# Patient Record
Sex: Male | Born: 1960
Health system: Southern US, Community
[De-identification: ages and names within clinical notes are randomized; demographics above are authoritative.]

## PROBLEM LIST (undated history)

## (undated) DIAGNOSIS — E7800 Pure hypercholesterolemia, unspecified: Secondary | ICD-10-CM

## (undated) DIAGNOSIS — R52 Pain, unspecified: Secondary | ICD-10-CM

## (undated) DIAGNOSIS — J40 Bronchitis, not specified as acute or chronic: Secondary | ICD-10-CM

## (undated) DIAGNOSIS — Z87442 Personal history of urinary calculi: Secondary | ICD-10-CM

## (undated) DIAGNOSIS — K219 Gastro-esophageal reflux disease without esophagitis: Secondary | ICD-10-CM

## (undated) DIAGNOSIS — M1990 Unspecified osteoarthritis, unspecified site: Secondary | ICD-10-CM

## (undated) DIAGNOSIS — B029 Zoster without complications: Secondary | ICD-10-CM

## (undated) DIAGNOSIS — E119 Type 2 diabetes mellitus without complications: Secondary | ICD-10-CM

## (undated) DIAGNOSIS — I10 Essential (primary) hypertension: Secondary | ICD-10-CM

## (undated) DIAGNOSIS — J189 Pneumonia, unspecified organism: Secondary | ICD-10-CM

## (undated) HISTORY — PX: OTHER SURGICAL HISTORY: SHX169

---

## 2001-06-18 ENCOUNTER — Ambulatory Visit (HOSPITAL_COMMUNITY): Admission: RE | Admit: 2001-06-18 | Discharge: 2001-06-18 | Payer: Self-pay | Admitting: Neurosurgery

## 2001-06-18 ENCOUNTER — Encounter: Payer: Self-pay | Admitting: Neurosurgery

## 2001-06-26 ENCOUNTER — Encounter: Payer: Self-pay | Admitting: Neurosurgery

## 2001-06-26 ENCOUNTER — Inpatient Hospital Stay (HOSPITAL_COMMUNITY): Admission: RE | Admit: 2001-06-26 | Discharge: 2001-06-27 | Payer: Self-pay | Admitting: Neurosurgery

## 2001-07-23 ENCOUNTER — Encounter: Payer: Self-pay | Admitting: Neurosurgery

## 2001-07-23 ENCOUNTER — Encounter: Admission: RE | Admit: 2001-07-23 | Discharge: 2001-07-23 | Payer: Self-pay | Admitting: Neurosurgery

## 2006-01-04 ENCOUNTER — Ambulatory Visit (HOSPITAL_COMMUNITY): Admission: RE | Admit: 2006-01-04 | Discharge: 2006-01-04 | Payer: Self-pay | Admitting: Pulmonary Disease

## 2010-04-02 HISTORY — PX: OTHER SURGICAL HISTORY: SHX169

## 2010-11-14 ENCOUNTER — Ambulatory Visit (HOSPITAL_BASED_OUTPATIENT_CLINIC_OR_DEPARTMENT_OTHER)
Admission: RE | Admit: 2010-11-14 | Discharge: 2010-11-14 | Disposition: A | Discharge status: Home / Self Care | Payer: Worker's Compensation | Source: Ambulatory Visit | Attending: Orthopedic Surgery | Admitting: Orthopedic Surgery

## 2010-11-14 DIAGNOSIS — Z01810 Encounter for preprocedural cardiovascular examination: Secondary | ICD-10-CM | POA: Insufficient documentation

## 2010-11-14 DIAGNOSIS — IMO0002 Reserved for concepts with insufficient information to code with codable children: Secondary | ICD-10-CM | POA: Insufficient documentation

## 2010-11-14 DIAGNOSIS — X58XXXA Exposure to other specified factors, initial encounter: Secondary | ICD-10-CM | POA: Insufficient documentation

## 2010-11-14 DIAGNOSIS — Z01812 Encounter for preprocedural laboratory examination: Secondary | ICD-10-CM | POA: Insufficient documentation

## 2010-11-14 LAB — POCT I-STAT 4, (NA,K, GLUC, HGB,HCT)
Glucose, Bld: 131 mg/dL — ABNORMAL HIGH (ref 70–99)
HCT: 53 % — ABNORMAL HIGH (ref 39.0–52.0)
Hemoglobin: 18 g/dL — ABNORMAL HIGH (ref 13.0–17.0)
Potassium: 4.3 mEq/L (ref 3.5–5.1)
Sodium: 141 mEq/L (ref 135–145)

## 2010-11-20 NOTE — Op Note (Signed)
NAME:  Tony Santos, Tony Santos NO.:  [id number]  MEDICAL RECORD NO.:  [id number]  LOCATION:                                 FACILITY:  PHYSICIAN:  Ollen Gross, M.D.         DATE OF BIRTH:  DATE OF PROCEDURE:  11/14/2010 DATE OF DISCHARGE:                              OPERATIVE REPORT   PREOPERATIVE DIAGNOSIS:  Right knee medial meniscal tear.  POSTOPERATIVE DIAGNOSIS:  Right knee medial meniscal tear.  PROCEDURE:  Right knee arthroscopy and meniscal debridement.  SURGEON:  Ollen Gross, M.D.  ASSISTANT:  None.  ANESTHESIA:  General.  ESTIMATED BLOOD LOSS:  Minimal.  DRAINS:  None.  COMPLICATIONS:  None.  CONDITION:  Stable to recovery.  BRIEF CLINICAL NOTE:  Mr. Lazenby is a 50 year old male who had on-the- job injury over a month ago.  He developed severe right knee pain and mechanical symptoms.  Exam and history suggested medial meniscal tear confirmed by MRI.  He also has some medial compartment arthritis, but did not report having any symptoms prior to this injury.  He presents now for arthroscopy and debridement.  PROCEDURE IN DETAIL:  After successful administration of general anesthetic, a tourniquet was placed on his right thigh and his right lower extremity was prepped and draped in the usual sterile fashion. Standard superomedial and inferolateral incisions were made and flow cannula passed superomedial camera passed inferolateral.  Arthroscopic visualization proceeds.  Undersurface of patella and trochlea looked normal.  Mediolateral gutters visualized and there no loose bodies. Flexion valgus force applied to the knee and medial compartment centered.  On the medial most margin of the medial tibial plateau is a 1 x 1 cm area of exposed bone.  There was thin cartilage surrounding the rim of this, but it is stable.  There is significant tearing the body and posterior horn of the medial meniscus which is unstable.  There is also an area on  the medial femoral condyle about 2 x 2 cm with exposed bone.  A spinal needle was used to localize the inferomedial portal. Small incision made and dilator placed.  The unstable medial meniscus was then debrided back to stable base with a combination of baskets and a 4.2-mm shaver.  I then sealed off the edges with the ArthroCare device.  The joint surfaces were thoroughly inspected in the medial compartment and the right knee areas of exposed bone but no other unstable cartilage in the compartment.  Intercondylar notch was visualized and ACL was normal.  Lateral compartment centered and it looks normal.  Joints again inspected.  No other tears, defects, or loose bodies noted.  Arthroscopic equipment was then removed from the inferior portals which were closed with interrupted 4-0 nylon.  20 mL of 0.25% Marcaine with epi are injected through the inflow cannula and that is removed and that portal closed with nylon.  The incisions were then cleaned and dried and a bulky sterile dressing was applied.  He was then awakened and transported to recovery in stable condition.     Ollen Gross, M.D.     FA/MEDQ  D:  11/14/2010  T:  11/15/2010  Job:  [id number]  Electronically Signed by Ollen Gross M.D. on 11/20/2010 04:37:59 PM

## 2013-10-15 ENCOUNTER — Other Ambulatory Visit: Payer: Self-pay | Admitting: Orthopedic Surgery

## 2013-10-16 ENCOUNTER — Encounter (HOSPITAL_COMMUNITY): Payer: Self-pay | Admitting: Pharmacy Technician

## 2013-10-20 ENCOUNTER — Encounter (HOSPITAL_COMMUNITY)
Admission: RE | Admit: 2013-10-20 | Discharge: 2013-10-20 | Disposition: A | Discharge status: Home / Self Care | Payer: Worker's Compensation | Source: Ambulatory Visit | Attending: Orthopedic Surgery | Admitting: Orthopedic Surgery

## 2013-10-20 ENCOUNTER — Ambulatory Visit (HOSPITAL_COMMUNITY)
Admission: RE | Admit: 2013-10-20 | Discharge: 2013-10-20 | Disposition: A | Discharge status: Home / Self Care | Payer: Worker's Compensation | Source: Ambulatory Visit | Attending: Orthopedic Surgery | Admitting: Orthopedic Surgery

## 2013-10-20 ENCOUNTER — Encounter (INDEPENDENT_AMBULATORY_CARE_PROVIDER_SITE_OTHER): Payer: Self-pay

## 2013-10-20 ENCOUNTER — Encounter (HOSPITAL_COMMUNITY): Payer: Self-pay

## 2013-10-20 DIAGNOSIS — Z01812 Encounter for preprocedural laboratory examination: Secondary | ICD-10-CM | POA: Insufficient documentation

## 2013-10-20 DIAGNOSIS — Z01818 Encounter for other preprocedural examination: Secondary | ICD-10-CM | POA: Insufficient documentation

## 2013-10-20 DIAGNOSIS — Z981 Arthrodesis status: Secondary | ICD-10-CM | POA: Insufficient documentation

## 2013-10-20 DIAGNOSIS — Z01810 Encounter for preprocedural cardiovascular examination: Secondary | ICD-10-CM | POA: Insufficient documentation

## 2013-10-20 HISTORY — DX: Unspecified osteoarthritis, unspecified site: M19.90

## 2013-10-20 HISTORY — DX: Bronchitis, not specified as acute or chronic: J40

## 2013-10-20 HISTORY — DX: Pneumonia, unspecified organism: J18.9

## 2013-10-20 HISTORY — DX: Essential (primary) hypertension: I10

## 2013-10-20 HISTORY — DX: Personal history of urinary calculi: Z87.442

## 2013-10-20 HISTORY — DX: Zoster without complications: B02.9

## 2013-10-20 HISTORY — DX: Gastro-esophageal reflux disease without esophagitis: K21.9

## 2013-10-20 HISTORY — DX: Pain, unspecified: R52

## 2013-10-20 HISTORY — DX: Pure hypercholesterolemia, unspecified: E78.00

## 2013-10-20 LAB — COMPREHENSIVE METABOLIC PANEL
ALT: 36 U/L (ref 0–53)
ANION GAP: 14 (ref 5–15)
AST: 26 U/L (ref 0–37)
Albumin: 4.1 g/dL (ref 3.5–5.2)
Alkaline Phosphatase: 80 U/L (ref 39–117)
BILIRUBIN TOTAL: 0.2 mg/dL — AB (ref 0.3–1.2)
BUN: 16 mg/dL (ref 6–23)
CALCIUM: 9.5 mg/dL (ref 8.4–10.5)
CHLORIDE: 95 meq/L — AB (ref 96–112)
CO2: 28 mEq/L (ref 19–32)
CREATININE: 0.97 mg/dL (ref 0.50–1.35)
GFR calc Af Amer: >90 mL/min (ref >90)
GFR calc non Af Amer: >90 mL/min (ref >90)
Glucose, Bld: 324 mg/dL — ABNORMAL HIGH (ref 70–99)
Potassium: 4.5 mEq/L (ref 3.7–5.3)
Sodium: 137 mEq/L (ref 137–147)
Total Protein: 7.5 g/dL (ref 6.0–8.3)

## 2013-10-20 LAB — CBC
HEMATOCRIT: 49.7 % (ref 39.0–52.0)
Hemoglobin: 16.8 g/dL (ref 13.0–17.0)
MCH: 31.3 pg (ref 26.0–34.0)
MCHC: 33.8 g/dL (ref 30.0–36.0)
MCV: 92.7 fL (ref 78.0–100.0)
PLATELETS: 233 10*3/uL (ref 150–400)
RBC: 5.36 MIL/uL (ref 4.22–5.81)
RDW: 12.8 % (ref 11.5–15.5)
WBC: 9.5 10*3/uL (ref 4.0–10.5)

## 2013-10-20 LAB — SURGICAL PCR SCREEN
MRSA, PCR: NEGATIVE
Staphylococcus aureus: NEGATIVE

## 2013-10-20 LAB — URINALYSIS, ROUTINE W REFLEX MICROSCOPIC
BILIRUBIN URINE: NEGATIVE
Glucose, UA: >1000 mg/dL — AB
HGB URINE DIPSTICK: NEGATIVE
KETONES UR: NEGATIVE mg/dL
Leukocytes, UA: NEGATIVE
Nitrite: NEGATIVE
Protein, ur: NEGATIVE mg/dL
SPECIFIC GRAVITY, URINE: 1.029 (ref 1.005–1.030)
Urobilinogen, UA: 0.2 mg/dL (ref 0.0–1.0)
pH: 6.5 (ref 5.0–8.0)

## 2013-10-20 LAB — PROTIME-INR
INR: 0.97 (ref 0.00–1.49)
PROTHROMBIN TIME: 12.9 s (ref 11.6–15.2)

## 2013-10-20 LAB — URINE MICROSCOPIC-ADD ON

## 2013-10-20 LAB — ABO/RH: ABO/RH(D): A POS

## 2013-10-20 LAB — APTT: aPTT: 28 seconds (ref 24–37)

## 2013-10-20 NOTE — Pre-Procedure Instructions (Signed)
EKG AND CXR WERE DONE TODAY - [facility]. PT'S PREOP CMET AND URINALYSIS, URINE MICROSCOPIC REPORTS WERE FAXED TO DR. ALUISIO'S OFFICE - GLUCOSE 324 - PT DID NOT GIVE ANY HISTORY OF DIABETES.

## 2013-10-20 NOTE — Progress Notes (Signed)
10/20/13 0830  OBSTRUCTIVE SLEEP APNEA  Have you ever been diagnosed with sleep apnea through a sleep study? No  Do you snore loudly (loud enough to be heard through closed doors)?  0  Do you often feel tired, fatigued, or sleepy during the daytime? 0  Has anyone observed you stop breathing during your sleep? 0  Do you have, or are you being treated for high blood pressure? 1  BMI more than 35 kg/m2? 1  Age over 54 years old? 1  Neck circumference greater than 40 cm/16 inches? 1  Gender: 1  Obstructive Sleep Apnea Score 5  Score 4 or greater  Results sent to PCP

## 2013-10-20 NOTE — Patient Instructions (Signed)
[facility]EDUL[facility]  ON:  Monday  7/27  REPORT TO  SHORT STAY CENTER AT:  6:30 AM   PLEASE COME IN THE [facility] MAIN HOSPITAL ENTRANCE AND FOLLOW SIGNS TO SHORT STAY CENTER.  DO NOT EAT OR DRINK ANYTHING AFTER MIDNIGHT THE NIGHT BEFORE YOUR SURGERY.  YOU MAY BRUSH YOUR TEETH, RINSE OUT YOUR MOUTH--BUT NO WATER, NO FOOD, NO CHEWING GUM, NO MINTS, NO CANDIES, NO CHEWING TOBACCO.  PLEASE TAKE THE FOLLOWING MEDICATIONS THE AM OF YOUR SURGERY WITH A FEW SIPS OF WATER:  BUPROPION, CRESTOR, PRILOSEC ( OMEPRAZOLE ) AND MAY TAKE HYDROCODONE / ACETAMINOPHEN FOR PAIN IF NEEDED.   DO NOT BRING VALUABLES, MONEY, CREDIT CARDS.  DO NOT WEAR JEWELRY, MAKE-UP, NAIL POLISH AND NO METAL PINS OR CLIPS IN YOUR HAIR. CONTACT LENS, DENTURES / PARTIALS, GLASSES SHOULD NOT BE WORN TO SURGERY AND IN MOST CASES-HEARING AIDS WILL NEED TO BE REMOVED.  BRING YOUR GLASSES CASE, ANY EQUIPMENT NEEDED FOR YOUR CONTACT LENS. FOR PATIENTS ADMITTED TO THE HOSPITAL--CHECK OUT TIME THE DAY OF DISCHARGE IS 11:00 AM.  ALL INPATIENT ROOMS ARE PRIVATE - WITH BATHROOM, TELEPHONE, TELEVISION AND WIFI INTERNET.                                                    PLEASE READ OVER ANY  FACT SHEETS THAT YOU WERE GIVEN: MRSA INFORMATION, BLOOD TRANSFUSION INFORMATION, INCENTIVE SPIROMETER INFORMATION.  PLEASE BE AWARE THAT YOU MAY NEED ADDITIONAL BLOOD DRAWN DAY OF YOUR SURGERY  _______________________________________________________________________   [facility] Pre[facility] Before surgery, you can play an important role.  Because skin is not sterile, your skin needs to be as free of germs as possible.  You can reduce the number of germs on your skin by washing with CHG (chlorahexidine gluconate) soap before surgery.  CHG is an antiseptic cleaner which kills germs and bonds with the skin to continue killing germs even after washing. Please DO NOT use if you have an allergy to CHG or  antibacterial soaps.  If your skin becomes reddened/irritated stop using the CHG and inform your nurse when you arrive at Short Stay. Do not shave (including legs and underarms) for at least 48 hours prior to the first CHG shower.  You may shave your face/neck. Please follow these instructions carefully:  1.  Shower with CHG Soap the night before surgery and the  morning of Surgery.  2.  If you choose to wash your hair, wash your hair first as usual with your  normal  shampoo.  3.  After you shampoo, rinse your hair and body thoroughly to remove the  shampoo.                           4.  Use CHG as you would any other liquid soap.  You can apply chg directly  to the skin and wash                       Gently with a scrungie or clean washcloth.  5.  Apply the CHG Soap to your body ONLY FROM THE NECK DOWN.   Do not use on face/ open  Wound or open sores. Avoid contact with eyes, ears mouth and genitals (private parts).                       Wash face,  Genitals (private parts) with your normal soap.             6.  Wash thoroughly, paying special attention to the area where your surgery  will be performed.  7.  Thoroughly rinse your body with warm water from the neck down.  8.  DO NOT shower/wash with your normal soap after using and rinsing off  the CHG Soap.                9.  Pat yourself dry with a clean towel.            10.  Wear clean pajamas.            11.  Place clean sheets on your bed the night of your first shower and do not  sleep with pets. Day of Surgery : Do not apply any lotions/deodorants the morning of surgery.  Please wear clean clothes to the hospital/surgery center.  FAILURE TO FOLLOW THESE INSTRUCTIONS MAY RESULT IN THE CANCELLATION OF YOUR SURGERY PATIENT SIGNATURE_________________________________  NURSE SIGNATURE__________________________________  ________________________________________________________________________   Adam Phenix  An incentive spirometer is a tool that can help keep your lungs clear and active. This tool measures how well you are filling your lungs with each breath. Taking long deep breaths may help reverse or decrease the chance of developing breathing (pulmonary) problems (especially infection) following:  A long period of time when you are unable to move or be active. BEFORE THE PROCEDURE   If the spirometer includes an indicator to show your best effort, your nurse or respiratory therapist will set it to a desired goal.  If possible, sit up straight or lean slightly forward. Try not to slouch.  Hold the incentive spirometer in an upright position. INSTRUCTIONS FOR USE  1. Sit on the edge of your bed if possible, or sit up as far as you can in bed or on a chair. 2. Hold the incentive spirometer in an upright position. 3. Breathe out normally. 4. Place the mouthpiece in your mouth and seal your lips tightly around it. 5. Breathe in slowly and as deeply as possible, raising the piston or the ball toward the top of the column. 6. Hold your breath for 3-5 seconds or for as long as possible. Allow the piston or ball to fall to the bottom of the column. 7. Remove the mouthpiece from your mouth and breathe out normally. 8. Rest for a few seconds and repeat Steps 1 through 7 at least 10 times every 1-2 hours when you are awake. Take your time and take a few normal breaths between deep breaths. 9. The spirometer may include an indicator to show your best effort. Use the indicator as a goal to work toward during each repetition. 10. After each set of 10 deep breaths, practice coughing to be sure your lungs are clear. If you have an incision (the cut made at the time of surgery), support your incision when coughing by placing a pillow or rolled up towels firmly against it. Once you are able to get out of bed, walk around indoors and cough well. You may stop using the incentive spirometer when  instructed by your caregiver.  RISKS AND COMPLICATIONS  Take your time so you do not  get dizzy or light-headed.  If you are in pain, you may need to take or ask for pain medication before doing incentive spirometry. It is harder to take a deep breath if you are having pain. AFTER USE  Rest and breathe slowly and easily.  It can be helpful to keep track of a log of your progress. Your caregiver can provide you with a simple table to help with this. If you are using the spirometer at home, follow these instructions: [place] IF:   You are having difficultly using the spirometer.  You have trouble using the spirometer as often as instructed.  Your pain medication is not giving enough relief while using the spirometer.  You develop fever of 100.5 F (38.1 C) or higher. SEEK IMMEDIATE MEDICAL CARE IF:   You cough up bloody sputum that had not been present before.  You develop fever of 102 F (38.9 C) or greater.  You develop worsening pain at or near the incision site. MAKE SURE YOU:   Understand these instructions.  Will watch your condition.  Will get help right away if you are not doing well or get worse. Document Released: 07/30/2006 Document Revised: 06/11/2011 Document Reviewed: 09/30/2006 ExitCare Patient Information 2014 ExitCare, [place].   ________________________________________________________________________  WHAT IS A BLOOD TRANSFUSION? Blood Transfusion Information  A transfusion is the replacement of blood or some of its parts. Blood is made up of multiple cells which provide different functions.  Red blood cells carry oxygen and are used for blood loss replacement.  White blood cells fight against infection.  Platelets control bleeding.  Plasma helps clot blood.  Other blood products are available for specialized needs, such as hemophilia or other clotting disorders. BEFORE THE TRANSFUSION  Who gives blood for transfusions?   Healthy  volunteers who are fully evaluated to make sure their blood is safe. This is blood bank blood. Transfusion therapy is the safest it has ever been in the practice of medicine. Before blood is taken from a donor, a complete history is taken to make sure that person has no history of diseases nor engages in risky social behavior (examples are intravenous drug use or sexual activity with multiple partners). The donor's travel history is screened to minimize risk of transmitting infections, such as malaria. The donated blood is tested for signs of infectious diseases, such as HIV and hepatitis. The blood is then tested to be sure it is compatible with you in order to minimize the chance of a transfusion reaction. If you or a relative donates blood, this is often done in anticipation of surgery and is not appropriate for emergency situations. It takes many days to process the donated blood. RISKS AND COMPLICATIONS Although transfusion therapy is very safe and saves many lives, the main dangers of transfusion include:   Getting an infectious disease.  Developing a transfusion reaction. This is an allergic reaction to something in the blood you were given. Every precaution is taken to prevent this. The decision to have a blood transfusion has been considered carefully by your caregiver before blood is given. Blood is not given unless the benefits outweigh the risks. AFTER THE TRANSFUSION  Right after receiving a blood transfusion, you will usually feel much better and more energetic. This is especially true if your red blood cells have gotten low (anemic). The transfusion raises the level of the red blood cells which carry oxygen, and this usually causes an energy increase.  The nurse administering the transfusion will **Note De-Identified Weidmann Obfuscation** monitor you carefully for complications. HOME CARE INSTRUCTIONS  No special instructions are needed after a transfusion. You may find your energy is better. Speak with your caregiver about any  limitations on activity for underlying diseases you may have. SEEK MEDICAL CARE IF:   Your condition is not improving after your transfusion.  You develop redness or irritation at the intravenous (IV) site. SEEK IMMEDIATE MEDICAL CARE IF:  Any of the following symptoms occur over the next 12 hours:  Shaking chills.  You have a temperature by mouth above 102 F (38.9 C), not controlled by medicine.  Chest, back, or muscle pain.  People around you feel you are not acting correctly or are confused.  Shortness of breath or difficulty breathing.  Dizziness and fainting.  You get a rash or develop hives.  You have a decrease in urine output.  Your urine turns a dark color or changes to pink, red, or brown. Any of the following symptoms occur over the next 10 days:  You have a temperature by mouth above 102 F (38.9 C), not controlled by medicine.  Shortness of breath.  Weakness after normal activity.  The [place]decai part of the eye turns yellow (jaundice).  You have a decrease in the amount of urine or are urinating less often.  Your urine turns a dark color or changes to pink, red, or brown. Document Released: 03/16/2000 Document Revised: 06/11/2011 Document Reviewed: 11/03/2007 [facility] Patient Information 2014 [place], [place].  _______________________________________________________________________

## 2013-10-23 ENCOUNTER — Other Ambulatory Visit: Payer: Self-pay | Admitting: Orthopedic Surgery

## 2013-10-25 ENCOUNTER — Other Ambulatory Visit: Payer: Self-pay | Admitting: Orthopedic Surgery

## 2013-10-25 NOTE — H&P (Signed)
Tony Santos DOB: 02/27/1961 Married / Language: English / Race: White Male  Date of admission:  10/26/2012  Chief Complaint:  Right Knee Pain  History of Present Illness The patient is a 53 year old male who comes in for a preoperative History and Physical. The patient is scheduled for a right total knee arthroplasty to be performed by Dr. Frank V. Aluisio, MD at [facility] on 10-26-2013. The patient is a 52 year old male who presents for follow up of their knee. The patient is being followed for their right knee pain and osteoarthritis. They are now 2 year(s) (2 1/2 months) out from arthroscopy. Symptoms reported today include: pain (that is getting progressively worse over time), pain at night, swelling (that increases throughout the day), popping and grinding. The following medication has been used for pain control: none. When I last saw him we had done a series of Synvisc injections which did not help much. He is trying to go as long as possible without surgery. He is at a stage now however where the knee is hurting at all times. Definitely limiting what he can and can not do. He is working on modified duties and he said that even with modified duties the knee still hurts. He is not having any significant mechanical type symptoms. We discussed two years ago that total knee replacement was going to be in his future. Unfortunately his pain has gotten worse to the point where he is ready to go ahead and proceed with that now. Everything is discussed in detail today and he would like to proceed. They have been treated conservatively in the past for the above stated problem and despite conservative measures, they continue to have progressive pain and severe functional limitations and dysfunction. They have failed non-operative management including home exercise, medications, and injections. It is felt that they would benefit from undergoing total joint replacement. Risks and  benefits of the procedure have been discussed with the patient and they elect to proceed with surgery. There are no active contraindications to surgery such as ongoing infection or rapidly progressive neurological disease.   Allergies Penicillins. Rash, Hives.   Problem List/Past Medical Osteoarthritis, Knee (715.96) S/P arthroscopy of knee (V45.89) Gastroesophageal Reflux Disease High blood pressure Osteoarthritis Hypercholesterolemia Shingles Bronchitis. Past History Pneumonia. Past History Kidney Stone Scarlet Fever Mumps   Family History Heart disease in male family member before age 65 Hypertension. grandmother mothers side, grandfather mothers side, grandmother fathers side and grandfather fathers side Heart disease in male family member before age 55 Cancer. First Degree Relatives. mother Cerebrovascular Accident. grandmother fathers side and grandfather fathers side Heart Disease. father, grandmother mothers side, grandfather mothers side, grandmother fathers side and grandfather fathers side Diabetes Mellitus. grandmother mothers side    Social History Pain Contract. no Current work status. working full time Alcohol use. never consumed alcohol Previously in rehab. no Marital status. married Living situation. live with spouse Tobacco use. Never smoker. never smoker Drug/Alcohol Rehab (Currently). no Children. 2 Illicit drug use. no Exercise. 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 BuPROPion HCl ER (SR) (150MG Tablet ER 12HR, Oral) Active. Crestor (20MG Tablet, Oral) Active. Ibuprofen (200MG Capsule, 1 (one) Oral) Active. (prn) Omeprazole (20MG Tablet DR, Oral two times daily) Active. Multivital ( Oral) Active.    Past Surgical  History Neck Disc Surgery. Fusion Other Surgery.   RIGHT KNEE SURGERY, THUMB SURGERY TO SEW BACK ON.   Review of Systems General:Not Present- Chills, Fever, Night Sweats, Fatigue, Weight Gain, Weight Loss and Memory Loss. Skin:Not Present- Hives, Itching, Rash, Eczema and Lesions. HEENT:Not Present- Tinnitus, Headache, Double Vision, Visual Loss, Hearing Loss and Dentures. Respiratory:Present- Cough. Not Present- Shortness of breath with exertion, Shortness of breath at rest, Allergies, Coughing up blood and Chronic Cough. Cardiovascular:Not Present- Chest Pain, Racing/skipping heartbeats, Difficulty Breathing Lying Down, Murmur, Swelling and Palpitations. Gastrointestinal:Not Present- Bloody Stool, Heartburn, Abdominal Pain, Vomiting, Nausea, Constipation, Diarrhea, Difficulty Swallowing, Jaundice and Loss of appetitie. Male Genitourinary:Not Present- Urinary frequency, Blood in Urine, Weak urinary stream, Discharge, Flank Pain, Incontinence, Painful Urination, Urgency, Urinary Retention and Urinating at Night. Musculoskeletal:Present- Joint Pain. Not Present- Muscle Weakness, Muscle Pain, Joint Swelling, Back Pain, Morning Stiffness and Spasms. Neurological:Not Present- Tremor, Dizziness, Blackout spells, Paralysis, Difficulty with balance and Weakness. Psychiatric:Not Present- Insomnia.    Vitals Weight: 255 lb Height: 70 in Weight was reported by patient. Height was reported by patient. Body Surface Area: 2.39 m Body Mass Index: 36.59 kg/m BP: 144/80 (Sitting, Right Arm, Standard)     Physical Exam The physical exam findings are as follows:   General Mental Status - Alert, cooperative and good historian. General Appearance- pleasant. Not in acute distress. Orientation- Oriented X3. Build & Nutrition- Well nourished and Well developed.   Head and Neck Head- normocephalic, atraumatic . Neck Global Assessment- supple. no bruit auscultated  on the right and no bruit auscultated on the left.   Eye Pupil- Bilateral- Regular and Round. Motion- Bilateral- EOMI.   Chest and Lung Exam Auscultation: Breath sounds:- clear at anterior chest wall and - clear at posterior chest wall. Adventitious sounds:- No Adventitious sounds.   Cardiovascular Auscultation:Rhythm- Regular rate and rhythm. Heart Sounds- S1 WNL and S2 WNL. Murmurs & Other Heart Sounds:Auscultation of the heart reveals - No Murmurs.   Abdomen Palpation/Percussion:Tenderness- Abdomen is non-tender to palpation. Rigidity (guarding)- Abdomen is soft. Auscultation:Auscultation of the abdomen reveals - Bowel sounds normal.   Male Genitourinary  Not done, not pertinent to present illness  Musculoskeletal On exam he is alert and oriented in no apparent distress. His hips show normal range of motion, no discomfort. The left knee no effusion. Range about 5 to 125, slight crepitus on range of motion. No tenderness or instability. Right knee has a small effusion. His range of motion on the right is about 10 to 120. There is marked crepitus on range of motion. He is tender medially greater than laterally with no instability noted. Pulses, sensation and motor are intact.  RADIOGRAPHS: AP both knees and lateral of the right show he has significant bone on bone arthritis in the medial and patellofemoral compartments of the right knee.   Assessment & Plan Osteoarthritis, Knee (715.96) Impression: Right Knee  Note: Plan is for a Right Total Knee Replacement by Dr. Aluisio.  Plan is to go home.  PCP - Dr. Edward Hawkins  The patient does not have any contraindications and will receive TXA (tranexamic acid) prior to surgery.  Signed electronically by Alezandrew L Janney Priego, III PA-C 

## 2013-10-26 ENCOUNTER — Encounter (HOSPITAL_COMMUNITY): Payer: Self-pay | Admitting: *Deleted

## 2013-10-26 ENCOUNTER — Inpatient Hospital Stay (HOSPITAL_COMMUNITY)
Admission: RE | Admit: 2013-10-26 | Discharge: 2013-10-28 | DRG: 470 | Disposition: A | Discharge status: Home Health | Payer: Worker's Compensation | Source: Ambulatory Visit | Attending: Orthopedic Surgery | Admitting: Orthopedic Surgery

## 2013-10-26 ENCOUNTER — Encounter (HOSPITAL_COMMUNITY)
Admission: RE | Disposition: A | Discharge status: Home Health | Payer: Self-pay | Source: Ambulatory Visit | Attending: Orthopedic Surgery

## 2013-10-26 ENCOUNTER — Inpatient Hospital Stay (HOSPITAL_COMMUNITY): Payer: Worker's Compensation | Admitting: Anesthesiology

## 2013-10-26 ENCOUNTER — Encounter (HOSPITAL_COMMUNITY): Payer: Worker's Compensation | Admitting: Anesthesiology

## 2013-10-26 DIAGNOSIS — M1711 Unilateral primary osteoarthritis, right knee: Secondary | ICD-10-CM

## 2013-10-26 DIAGNOSIS — Z791 Long term (current) use of non-steroidal anti-inflammatories (NSAID): Secondary | ICD-10-CM

## 2013-10-26 DIAGNOSIS — I10 Essential (primary) hypertension: Secondary | ICD-10-CM | POA: Diagnosis present

## 2013-10-26 DIAGNOSIS — Z880 Allergy status to penicillin: Secondary | ICD-10-CM

## 2013-10-26 DIAGNOSIS — Z833 Family history of diabetes mellitus: Secondary | ICD-10-CM | POA: Diagnosis not present

## 2013-10-26 DIAGNOSIS — Z79899 Other long term (current) drug therapy: Secondary | ICD-10-CM | POA: Diagnosis not present

## 2013-10-26 DIAGNOSIS — E871 Hypo-osmolality and hyponatremia: Secondary | ICD-10-CM | POA: Diagnosis not present

## 2013-10-26 DIAGNOSIS — E7800 Pure hypercholesterolemia, unspecified: Secondary | ICD-10-CM | POA: Diagnosis present

## 2013-10-26 DIAGNOSIS — M1710 Unilateral primary osteoarthritis, unspecified knee: Secondary | ICD-10-CM | POA: Diagnosis present

## 2013-10-26 DIAGNOSIS — R7309 Other abnormal glucose: Secondary | ICD-10-CM | POA: Diagnosis present

## 2013-10-26 DIAGNOSIS — Z96651 Presence of right artificial knee joint: Secondary | ICD-10-CM

## 2013-10-26 DIAGNOSIS — Z981 Arthrodesis status: Secondary | ICD-10-CM

## 2013-10-26 DIAGNOSIS — Z8249 Family history of ischemic heart disease and other diseases of the circulatory system: Secondary | ICD-10-CM

## 2013-10-26 DIAGNOSIS — Z823 Family history of stroke: Secondary | ICD-10-CM

## 2013-10-26 DIAGNOSIS — K219 Gastro-esophageal reflux disease without esophagitis: Secondary | ICD-10-CM | POA: Diagnosis present

## 2013-10-26 DIAGNOSIS — M179 Osteoarthritis of knee, unspecified: Secondary | ICD-10-CM | POA: Diagnosis present

## 2013-10-26 DIAGNOSIS — M25569 Pain in unspecified knee: Secondary | ICD-10-CM | POA: Diagnosis present

## 2013-10-26 DIAGNOSIS — M898X9 Other specified disorders of bone, unspecified site: Secondary | ICD-10-CM | POA: Diagnosis present

## 2013-10-26 DIAGNOSIS — Z87442 Personal history of urinary calculi: Secondary | ICD-10-CM | POA: Diagnosis not present

## 2013-10-26 DIAGNOSIS — Z6838 Body mass index (BMI) 38.0-38.9, adult: Secondary | ICD-10-CM

## 2013-10-26 DIAGNOSIS — R739 Hyperglycemia, unspecified: Secondary | ICD-10-CM | POA: Diagnosis present

## 2013-10-26 HISTORY — PX: TOTAL KNEE ARTHROPLASTY: SHX125

## 2013-10-26 LAB — GLUCOSE, CAPILLARY
GLUCOSE-CAPILLARY: 288 mg/dL — AB (ref 70–99)
Glucose-Capillary: 175 mg/dL — ABNORMAL HIGH (ref 70–99)
Glucose-Capillary: 185 mg/dL — ABNORMAL HIGH (ref 70–99)
Glucose-Capillary: 240 mg/dL — ABNORMAL HIGH (ref 70–99)
Glucose-Capillary: 259 mg/dL — ABNORMAL HIGH (ref 70–99)

## 2013-10-26 LAB — TYPE AND SCREEN
ABO/RH(D): A POS
Antibody Screen: NEGATIVE

## 2013-10-26 SURGERY — ARTHROPLASTY, KNEE, TOTAL
Anesthesia: General | Site: Knee | Laterality: Right

## 2013-10-26 MED ORDER — METOCLOPRAMIDE HCL 5 MG/ML IJ SOLN: 5.0000 mg | Freq: Three times a day (TID) | INTRAMUSCULAR | Status: DC | PRN

## 2013-10-26 MED ORDER — HYDROMORPHONE HCL PF 1 MG/ML IJ SOLN
0.2500 mg | INTRAMUSCULAR | Status: DC | PRN
  Administered 2013-10-26 (×4): 0.5 mg via INTRAVENOUS

## 2013-10-26 MED ORDER — MIDAZOLAM HCL 2 MG/2ML IJ SOLN
INTRAMUSCULAR | Status: AC
  Filled 2013-10-26: qty 2

## 2013-10-26 MED ORDER — OXYCODONE HCL 5 MG PO TABS
5.0000 mg | ORAL_TABLET | ORAL | Status: DC | PRN
  Administered 2013-10-26 (×2): 5 mg via ORAL
  Administered 2013-10-26 – 2013-10-28 (×13): 10 mg via ORAL
  Filled 2013-10-26 (×2): qty 1
  Filled 2013-10-26 (×7): qty 2
  Filled 2013-10-26: qty 1
  Filled 2013-10-26 (×6): qty 2

## 2013-10-26 MED ORDER — HYDROMORPHONE HCL PF 2 MG/ML IJ SOLN
INTRAMUSCULAR | Status: AC
  Filled 2013-10-26: qty 1

## 2013-10-26 MED ORDER — AMLODIPINE BESYLATE 10 MG PO TABS
10.0000 mg | ORAL_TABLET | Freq: Once | ORAL | Status: AC
  Administered 2013-10-26: 10 mg via ORAL
  Filled 2013-10-26: qty 1

## 2013-10-26 MED ORDER — DEXAMETHASONE SODIUM PHOSPHATE 10 MG/ML IJ SOLN
10.0000 mg | Freq: Once | INTRAMUSCULAR | Status: AC
  Administered 2013-10-26: 10 mg via INTRAVENOUS

## 2013-10-26 MED ORDER — BUPIVACAINE HCL 0.25 % IJ SOLN
INTRAMUSCULAR | Status: DC | PRN
  Administered 2013-10-26: 30 mL

## 2013-10-26 MED ORDER — LACTATED RINGERS IV SOLN: INTRAVENOUS | Status: DC

## 2013-10-26 MED ORDER — LIDOCAINE HCL (PF) 2 % IJ SOLN
INTRAMUSCULAR | Status: DC | PRN
  Administered 2013-10-26: 75 mg via INTRADERMAL

## 2013-10-26 MED ORDER — LACTATED RINGERS IV SOLN
INTRAVENOUS | Status: DC | PRN
  Administered 2013-10-26 (×2): via INTRAVENOUS

## 2013-10-26 MED ORDER — FENTANYL CITRATE 0.05 MG/ML IJ SOLN
INTRAMUSCULAR | Status: DC | PRN
  Administered 2013-10-26: 100 ug via INTRAVENOUS
  Administered 2013-10-26: 50 ug via INTRAVENOUS
  Administered 2013-10-26 (×2): 100 ug via INTRAVENOUS

## 2013-10-26 MED ORDER — ACETAMINOPHEN 650 MG RE SUPP: 650.0000 mg | Freq: Four times a day (QID) | RECTAL | Status: DC | PRN

## 2013-10-26 MED ORDER — CEFAZOLIN SODIUM-DEXTROSE 2-3 GM-% IV SOLR: 2.0000 g | INTRAVENOUS | Status: DC

## 2013-10-26 MED ORDER — DEXAMETHASONE SODIUM PHOSPHATE 10 MG/ML IJ SOLN
INTRAMUSCULAR | Status: AC
  Filled 2013-10-26: qty 1

## 2013-10-26 MED ORDER — FENTANYL CITRATE 0.05 MG/ML IJ SOLN
INTRAMUSCULAR | Status: AC
Start: 2013-10-26 — End: 2013-10-26
  Filled 2013-10-26: qty 5

## 2013-10-26 MED ORDER — BUPROPION HCL ER (SR) 150 MG PO TB12
150.0000 mg | ORAL_TABLET | Freq: Two times a day (BID) | ORAL | Status: DC
  Administered 2013-10-26 – 2013-10-28 (×4): 150 mg via ORAL
  Filled 2013-10-26 (×5): qty 1

## 2013-10-26 MED ORDER — SODIUM CHLORIDE 0.9 % IR SOLN
Status: DC | PRN
  Administered 2013-10-26: 1000 mL

## 2013-10-26 MED ORDER — SODIUM CHLORIDE 0.9 % IJ SOLN
INTRAMUSCULAR | Status: AC
  Filled 2013-10-26: qty 10

## 2013-10-26 MED ORDER — PROPOFOL 10 MG/ML IV BOLUS
INTRAVENOUS | Status: AC
  Filled 2013-10-26: qty 20

## 2013-10-26 MED ORDER — KETOROLAC TROMETHAMINE 15 MG/ML IJ SOLN
7.5000 mg | Freq: Four times a day (QID) | INTRAMUSCULAR | Status: AC | PRN
  Administered 2013-10-26 (×2): 7.5 mg via INTRAVENOUS
  Filled 2013-10-26: qty 1

## 2013-10-26 MED ORDER — METHOCARBAMOL 1000 MG/10ML IJ SOLN
500.0000 mg | Freq: Four times a day (QID) | INTRAMUSCULAR | Status: DC | PRN
  Administered 2013-10-26: 500 mg via INTRAVENOUS
  Filled 2013-10-26: qty 5

## 2013-10-26 MED ORDER — SODIUM CHLORIDE 0.9 % IJ SOLN
INTRAMUSCULAR | Status: AC
  Filled 2013-10-26: qty 50

## 2013-10-26 MED ORDER — OMEPRAZOLE MAGNESIUM 20 MG PO TBEC: 20.0000 mg | DELAYED_RELEASE_TABLET | Freq: Two times a day (BID) | ORAL | Status: DC

## 2013-10-26 MED ORDER — HYDROMORPHONE HCL PF 1 MG/ML IJ SOLN
INTRAMUSCULAR | Status: AC
  Filled 2013-10-26: qty 1

## 2013-10-26 MED ORDER — NEOSTIGMINE METHYLSULFATE 10 MG/10ML IV SOLN
INTRAVENOUS | Status: AC
  Filled 2013-10-26: qty 1

## 2013-10-26 MED ORDER — DEXAMETHASONE 6 MG PO TABS
10.0000 mg | ORAL_TABLET | Freq: Every day | ORAL | Status: AC
  Administered 2013-10-27: 10 mg via ORAL
  Filled 2013-10-26: qty 1

## 2013-10-26 MED ORDER — ACETAMINOPHEN 500 MG PO TABS
1000.0000 mg | ORAL_TABLET | Freq: Four times a day (QID) | ORAL | Status: AC
  Administered 2013-10-26 – 2013-10-27 (×4): 1000 mg via ORAL
  Filled 2013-10-26 (×4): qty 2

## 2013-10-26 MED ORDER — CHLORHEXIDINE GLUCONATE 4 % EX LIQD: 60.0000 mL | Freq: Once | CUTANEOUS | Status: DC

## 2013-10-26 MED ORDER — CEFAZOLIN SODIUM-DEXTROSE 2-3 GM-% IV SOLR
2.0000 g | Freq: Four times a day (QID) | INTRAVENOUS | Status: AC
  Administered 2013-10-26 (×2): 2 g via INTRAVENOUS
  Filled 2013-10-26 (×2): qty 50

## 2013-10-26 MED ORDER — FLEET ENEMA 7-19 GM/118ML RE ENEM: 1.0000 | ENEMA | Freq: Once | RECTAL | Status: AC | PRN

## 2013-10-26 MED ORDER — PROPOFOL 10 MG/ML IV BOLUS
INTRAVENOUS | Status: DC | PRN
  Administered 2013-10-26: 200 mg via INTRAVENOUS

## 2013-10-26 MED ORDER — KETOROLAC TROMETHAMINE 15 MG/ML IJ SOLN
INTRAMUSCULAR | Status: AC
  Filled 2013-10-26: qty 1

## 2013-10-26 MED ORDER — PROMETHAZINE HCL 25 MG/ML IJ SOLN
6.2500 mg | INTRAMUSCULAR | Status: DC | PRN
Start: 2013-10-26 — End: 2013-10-26

## 2013-10-26 MED ORDER — EPHEDRINE SULFATE 50 MG/ML IJ SOLN
INTRAMUSCULAR | Status: AC
  Filled 2013-10-26: qty 1

## 2013-10-26 MED ORDER — SODIUM CHLORIDE 0.9 % IV SOLN: INTRAVENOUS | Status: DC

## 2013-10-26 MED ORDER — ACETAMINOPHEN 325 MG PO TABS: 650.0000 mg | ORAL_TABLET | Freq: Four times a day (QID) | ORAL | Status: DC | PRN

## 2013-10-26 MED ORDER — ONDANSETRON HCL 4 MG/2ML IJ SOLN: 4.0000 mg | Freq: Four times a day (QID) | INTRAMUSCULAR | Status: DC | PRN

## 2013-10-26 MED ORDER — ONDANSETRON HCL 4 MG PO TABS
4.0000 mg | ORAL_TABLET | Freq: Four times a day (QID) | ORAL | Status: DC | PRN
  Administered 2013-10-28: 4 mg via ORAL
  Filled 2013-10-26: qty 1

## 2013-10-26 MED ORDER — GLYCOPYRROLATE 0.2 MG/ML IJ SOLN
INTRAMUSCULAR | Status: DC | PRN
  Administered 2013-10-26: 0.6 mg via INTRAVENOUS

## 2013-10-26 MED ORDER — ROCURONIUM BROMIDE 100 MG/10ML IV SOLN
INTRAVENOUS | Status: DC | PRN
  Administered 2013-10-26: 50 mg via INTRAVENOUS

## 2013-10-26 MED ORDER — METHOCARBAMOL 500 MG PO TABS
500.0000 mg | ORAL_TABLET | Freq: Four times a day (QID) | ORAL | Status: DC | PRN
  Administered 2013-10-26 – 2013-10-28 (×6): 500 mg via ORAL
  Filled 2013-10-26 (×6): qty 1

## 2013-10-26 MED ORDER — SUCCINYLCHOLINE CHLORIDE 20 MG/ML IJ SOLN
INTRAMUSCULAR | Status: DC | PRN
  Administered 2013-10-26: 60 mg via INTRAVENOUS
  Administered 2013-10-26: 140 mg via INTRAVENOUS

## 2013-10-26 MED ORDER — FENTANYL CITRATE 0.05 MG/ML IJ SOLN
INTRAMUSCULAR | Status: AC
  Filled 2013-10-26: qty 2

## 2013-10-26 MED ORDER — SODIUM CHLORIDE 0.9 % IJ SOLN
INTRAMUSCULAR | Status: DC | PRN
  Administered 2013-10-26: 30 mL

## 2013-10-26 MED ORDER — POLYETHYLENE GLYCOL 3350 17 G PO PACK
17.0000 g | PACK | Freq: Every day | ORAL | Status: DC | PRN
  Administered 2013-10-27: 17 g via ORAL

## 2013-10-26 MED ORDER — SODIUM CHLORIDE 0.9 % IV SOLN
INTRAVENOUS | Status: DC
  Administered 2013-10-26 – 2013-10-27 (×3): via INTRAVENOUS

## 2013-10-26 MED ORDER — MORPHINE SULFATE 2 MG/ML IJ SOLN
1.0000 mg | INTRAMUSCULAR | Status: DC | PRN
  Administered 2013-10-26 – 2013-10-27 (×4): 2 mg via INTRAVENOUS
  Filled 2013-10-26 (×4): qty 1

## 2013-10-26 MED ORDER — MIDAZOLAM HCL 5 MG/5ML IJ SOLN
INTRAMUSCULAR | Status: DC | PRN
  Administered 2013-10-26: 2 mg via INTRAVENOUS

## 2013-10-26 MED ORDER — ONDANSETRON HCL 4 MG/2ML IJ SOLN
INTRAMUSCULAR | Status: DC | PRN
  Administered 2013-10-26: 4 mg via INTRAVENOUS

## 2013-10-26 MED ORDER — BUPIVACAINE LIPOSOME 1.3 % IJ SUSP
20.0000 mL | Freq: Once | INTRAMUSCULAR | Status: DC
  Filled 2013-10-26: qty 20

## 2013-10-26 MED ORDER — BUPIVACAINE LIPOSOME 1.3 % IJ SUSP
INTRAMUSCULAR | Status: DC | PRN
  Administered 2013-10-26: 20 mL

## 2013-10-26 MED ORDER — PHENOL 1.4 % MT LIQD
1.0000 | OROMUCOSAL | Status: DC | PRN
Start: 2013-10-26 — End: 2013-10-28

## 2013-10-26 MED ORDER — RIVAROXABAN 10 MG PO TABS
10.0000 mg | ORAL_TABLET | Freq: Every day | ORAL | Status: DC
  Administered 2013-10-27 – 2013-10-28 (×2): 10 mg via ORAL
  Filled 2013-10-26 (×3): qty 1

## 2013-10-26 MED ORDER — NEOSTIGMINE METHYLSULFATE 10 MG/10ML IV SOLN
INTRAVENOUS | Status: DC | PRN
  Administered 2013-10-26: 5 mg via INTRAVENOUS

## 2013-10-26 MED ORDER — BISACODYL 10 MG RE SUPP: 10.0000 mg | Freq: Every day | RECTAL | Status: DC | PRN

## 2013-10-26 MED ORDER — TRANEXAMIC ACID 100 MG/ML IV SOLN
1000.0000 mg | INTRAVENOUS | Status: AC
  Administered 2013-10-26: 1000 mg via INTRAVENOUS
  Filled 2013-10-26: qty 10

## 2013-10-26 MED ORDER — HYDROMORPHONE HCL PF 1 MG/ML IJ SOLN
INTRAMUSCULAR | Status: DC | PRN
  Administered 2013-10-26 (×4): 0.5 mg via INTRAVENOUS

## 2013-10-26 MED ORDER — GLYCOPYRROLATE 0.2 MG/ML IJ SOLN
INTRAMUSCULAR | Status: AC
  Filled 2013-10-26: qty 3

## 2013-10-26 MED ORDER — OMEPRAZOLE 20 MG PO CPDR
20.0000 mg | DELAYED_RELEASE_CAPSULE | Freq: Two times a day (BID) | ORAL | Status: DC
  Administered 2013-10-26 – 2013-10-28 (×4): 20 mg via ORAL
  Filled 2013-10-26 (×6): qty 1

## 2013-10-26 MED ORDER — ONDANSETRON HCL 4 MG/2ML IJ SOLN
INTRAMUSCULAR | Status: AC
  Filled 2013-10-26: qty 2

## 2013-10-26 MED ORDER — BUPIVACAINE-EPINEPHRINE (PF) 0.25% -1:200000 IJ SOLN
INTRAMUSCULAR | Status: AC
  Filled 2013-10-26: qty 30

## 2013-10-26 MED ORDER — TRAMADOL HCL 50 MG PO TABS
50.0000 mg | ORAL_TABLET | Freq: Four times a day (QID) | ORAL | Status: DC | PRN
  Administered 2013-10-27: 50 mg via ORAL
  Filled 2013-10-26: qty 1

## 2013-10-26 MED ORDER — DEXAMETHASONE SODIUM PHOSPHATE 10 MG/ML IJ SOLN
10.0000 mg | Freq: Every day | INTRAMUSCULAR | Status: AC
  Filled 2013-10-26: qty 1

## 2013-10-26 MED ORDER — MENTHOL 3 MG MT LOZG
1.0000 | LOZENGE | OROMUCOSAL | Status: DC | PRN
  Filled 2013-10-26: qty 9

## 2013-10-26 MED ORDER — DIPHENHYDRAMINE HCL 12.5 MG/5ML PO ELIX: 12.5000 mg | ORAL_SOLUTION | ORAL | Status: DC | PRN

## 2013-10-26 MED ORDER — METOCLOPRAMIDE HCL 10 MG PO TABS: 5.0000 mg | ORAL_TABLET | Freq: Three times a day (TID) | ORAL | Status: DC | PRN

## 2013-10-26 MED ORDER — ACETAMINOPHEN 10 MG/ML IV SOLN
1000.0000 mg | Freq: Once | INTRAVENOUS | Status: AC
  Administered 2013-10-26: 1000 mg via INTRAVENOUS
  Filled 2013-10-26: qty 100

## 2013-10-26 MED ORDER — DEXTROSE 5 % IV SOLN
3.0000 g | Freq: Once | INTRAVENOUS | Status: AC
  Administered 2013-10-26: 3 g via INTRAVENOUS
  Filled 2013-10-26: qty 3000

## 2013-10-26 MED ORDER — INSULIN ASPART 100 UNIT/ML ~~LOC~~ SOLN
0.0000 [IU] | Freq: Three times a day (TID) | SUBCUTANEOUS | Status: DC
  Administered 2013-10-26: 8 [IU] via SUBCUTANEOUS
  Administered 2013-10-27: 5 [IU] via SUBCUTANEOUS
  Administered 2013-10-27: 3 [IU] via SUBCUTANEOUS
  Administered 2013-10-28 (×2): 5 [IU] via SUBCUTANEOUS

## 2013-10-26 MED ORDER — DOCUSATE SODIUM 100 MG PO CAPS
100.0000 mg | ORAL_CAPSULE | Freq: Two times a day (BID) | ORAL | Status: DC
  Administered 2013-10-26 – 2013-10-28 (×4): 100 mg via ORAL

## 2013-10-26 SURGICAL SUPPLY — 62 items
BAG SPEC THK2 15X12 ZIP CLS (MISCELLANEOUS) ×1
BAG ZIPLOCK 12X15 (MISCELLANEOUS) ×3 IMPLANT
BANDAGE ELASTIC 6 VELCRO ST LF (GAUZE/BANDAGES/DRESSINGS) ×3 IMPLANT
BANDAGE ESMARK 6X9 LF (GAUZE/BANDAGES/DRESSINGS) ×1 IMPLANT
BLADE SAG 18X100X1.27 (BLADE) ×3 IMPLANT
BLADE SAW SGTL 11.0X1.19X90.0M (BLADE) ×3 IMPLANT
BNDG CMPR 9X6 STRL LF SNTH (GAUZE/BANDAGES/DRESSINGS) ×1
BNDG ESMARK 6X9 LF (GAUZE/BANDAGES/DRESSINGS) ×3
BOWL SMART MIX CTS (DISPOSABLE) ×3 IMPLANT
CAP KNEE ATTUNE RP ×2 IMPLANT
CEMENT HV SMART SET (Cement) ×6 IMPLANT
CLOSURE WOUND 1/2 X4 (GAUZE/BANDAGES/DRESSINGS) ×1
CUFF TOURN SGL QUICK 34 (TOURNIQUET CUFF) ×3
CUFF TRNQT CYL 34X4X40X1 (TOURNIQUET CUFF) ×1 IMPLANT
DECANTER SPIKE VIAL GLASS SM (MISCELLANEOUS) ×3 IMPLANT
DRAPE EXTREMITY T 121X128X90 (DRAPE) ×3 IMPLANT
DRAPE POUCH INSTRU U-SHP 10X18 (DRAPES) ×3 IMPLANT
DRAPE U-SHAPE 47X51 STRL (DRAPES) ×3 IMPLANT
DRSG ADAPTIC 3X8 NADH LF (GAUZE/BANDAGES/DRESSINGS) ×3 IMPLANT
DRSG PAD ABDOMINAL 8X10 ST (GAUZE/BANDAGES/DRESSINGS) ×1 IMPLANT
DURAPREP 26ML APPLICATOR (WOUND CARE) ×3 IMPLANT
ELECT REM PT RETURN 9FT ADLT (ELECTROSURGICAL) ×3
ELECTRODE REM PT RTRN 9FT ADLT (ELECTROSURGICAL) ×1 IMPLANT
EVACUATOR 1/8 PVC DRAIN (DRAIN) ×3 IMPLANT
FACESHIELD WRAPAROUND (MASK) ×15 IMPLANT
FACESHIELD WRAPAROUND OR TEAM (MASK) ×5 IMPLANT
GAUZE SPONGE 4X4 12PLY STRL (GAUZE/BANDAGES/DRESSINGS) ×1 IMPLANT
GLOVE BIO SURGEON STRL SZ7.5 (GLOVE) IMPLANT
GLOVE BIO SURGEON STRL SZ8 (GLOVE) ×3 IMPLANT
GLOVE BIOGEL PI IND STRL 6.5 (GLOVE) IMPLANT
GLOVE BIOGEL PI IND STRL 8 (GLOVE) ×1 IMPLANT
GLOVE BIOGEL PI INDICATOR 6.5 (GLOVE)
GLOVE BIOGEL PI INDICATOR 8 (GLOVE) ×2
GLOVE SURG SS PI 6.5 STRL IVOR (GLOVE) IMPLANT
GOWN STRL REUS W/TWL LRG LVL3 (GOWN DISPOSABLE) ×3 IMPLANT
GOWN STRL REUS W/TWL XL LVL3 (GOWN DISPOSABLE) IMPLANT
HANDPIECE INTERPULSE COAX TIP (DISPOSABLE) ×3
IMMOBILIZER KNEE 20 (SOFTGOODS) ×2 IMPLANT
IMMOBILIZER KNEE 20 THIGH 36 (SOFTGOODS) ×1 IMPLANT
KIT BASIN OR (CUSTOM PROCEDURE TRAY) ×3 IMPLANT
MANIFOLD NEPTUNE II (INSTRUMENTS) ×3 IMPLANT
NDL SAFETY ECLIPSE 18X1.5 (NEEDLE) ×2 IMPLANT
NEEDLE HYPO 18GX1.5 SHARP (NEEDLE) ×6
NS IRRIG 1000ML POUR BTL (IV SOLUTION) ×3 IMPLANT
PACK TOTAL JOINT (CUSTOM PROCEDURE TRAY) ×3 IMPLANT
PAD ABD 8X10 STRL (GAUZE/BANDAGES/DRESSINGS) ×2 IMPLANT
PADDING CAST COTTON 6X4 STRL (CAST SUPPLIES) ×4 IMPLANT
POSITIONER SURGICAL ARM (MISCELLANEOUS) ×3 IMPLANT
SET HNDPC FAN SPRY TIP SCT (DISPOSABLE) ×1 IMPLANT
STRIP CLOSURE SKIN 1/2X4 (GAUZE/BANDAGES/DRESSINGS) ×3 IMPLANT
SUCTION FRAZIER 12FR DISP (SUCTIONS) ×3 IMPLANT
SUT MNCRL AB 4-0 PS2 18 (SUTURE) ×3 IMPLANT
SUT VIC AB 2-0 CT1 27 (SUTURE) ×9
SUT VIC AB 2-0 CT1 TAPERPNT 27 (SUTURE) ×3 IMPLANT
SUT VLOC 180 0 24IN GS25 (SUTURE) ×3 IMPLANT
SYRINGE 20CC LL (MISCELLANEOUS) ×3 IMPLANT
SYRINGE 60CC LL (MISCELLANEOUS) ×3 IMPLANT
TOWEL OR 17X26 10 PK STRL BLUE (TOWEL DISPOSABLE) ×3 IMPLANT
TOWEL OR NON WOVEN STRL DISP B (DISPOSABLE) IMPLANT
TRAY FOLEY CATH 14FRSI W/METER (CATHETERS) ×1 IMPLANT
WATER STERILE IRR 1500ML POUR (IV SOLUTION) ×3 IMPLANT
WRAP KNEE MAXI GEL POST OP (GAUZE/BANDAGES/DRESSINGS) ×3 IMPLANT

## 2013-10-26 NOTE — Evaluation (Signed)
Physical Therapy Evaluation Patient Details Name: Tony Santos MRN: [id number] DOB: 05/12/1960 Today's Date: 10/26/2013   History of Present Illness  53 yo male s/p R TKA 10/27/11.   Clinical Impression  On eval, pt required Min assist for mobility-able to ambulate ~40 feet with rolling walker. Anticipate pt will progress well during stay. Recommend HHPT.     Follow Up Recommendations Home health PT    Equipment Recommendations  Rolling walker with 5" wheels    Recommendations for Other Services OT consult     Precautions / Restrictions Precautions Precautions: Knee;Fall Required Braces or Orthoses: Knee Immobilizer - Right Knee Immobilizer - Right: Discontinue once straight leg raise with < 10 degree lag Restrictions Weight Bearing Restrictions: No      Mobility  Bed Mobility Overal bed mobility: Needs Assistance Bed Mobility: Supine to Sit     Supine to sit: Min assist     General bed mobility comments: assist for R LE  Transfers Overall transfer level: Needs assistance Equipment used: Rolling walker (2 wheeled) Transfers: Sit to/from Stand Sit to Stand: Min assist         General transfer comment: VCs safety, technique, hand placement. assist to rise, stabilize.   Ambulation/Gait Ambulation/Gait assistance: Min assist Ambulation Distance (Feet): 40 Feet Assistive device: Rolling walker (2 wheeled) Gait Pattern/deviations: Step-to pattern;Wide base of support;Decreased stride length     General Gait Details: slow gait speed. VCS safety, technique, sequence.   Stairs            Wheelchair Mobility    Modified Rankin (Stroke Patients Only)       Balance                                             Pertinent Vitals/Pain 2/10 at rest; 5/10 with activity. Ice applied end of session.     Home Living Family/patient expects to be discharged to:: Private residence Living Arrangements: Spouse/significant other Available  Help at Discharge: Family Type of Home: House Home Access: Stairs to enter Entrance Stairs-Rails: Right Entrance Stairs-Number of Steps: 3 Home Layout: One level Home Equipment: None      Prior Function Level of Independence: Independent               Hand Dominance        Extremity/Trunk Assessment               Lower Extremity Assessment: RLE deficits/detail RLE Deficits / Details: hip flex 2/5, hip abd/add 2/5, moves ankle well    Cervical / Trunk Assessment: Normal  Communication   Communication: No difficulties  Cognition Arousal/Alertness: Awake/alert Behavior During Therapy: WFL for tasks assessed/performed Overall Cognitive Status: Within Functional Limits for tasks assessed                      General Comments      Exercises        Assessment/Plan    PT Assessment Patient needs continued PT services  PT Diagnosis Difficulty walking;Acute pain   PT Problem List Decreased strength;Decreased range of motion;Decreased activity tolerance;Decreased balance;Decreased mobility;Pain;Decreased knowledge of use of DME;Decreased knowledge of precautions  PT Treatment Interventions DME instruction;Gait training;Stair training;Functional mobility training;Therapeutic activities;Therapeutic exercise;Patient/family education;Balance training   PT Goals (Current goals can be found in the Care Plan section) Acute Rehab PT Goals Patient Stated Goal: home.  PT Goal Formulation: With patient/family Time For Goal Achievement: 11/02/13 Potential to Achieve Goals: Good    Frequency 7X/week   Barriers to discharge        Co-evaluation               End of Session Equipment Utilized During Treatment: Gait belt Activity Tolerance: Patient tolerated treatment well Patient left: in chair;with call bell/phone within reach;with family/visitor present           Time: 1610-9604 PT Time Calculation (min): 16 min   Charges:   PT  Evaluation $Initial PT Evaluation Tier I: 1 Procedure PT Treatments $Gait Training: 8-22 mins   PT G Codes:          Rebeca Alert, MPT Pager: [phone]

## 2013-10-26 NOTE — H&P (View-Only) (Signed)
Tony Santos Tony Santos DOB: 01/27/1961 Married / Language: [place] / Race: White Male  Date of admission:  10/26/2012  Chief Complaint:  Right Knee Pain  History of Present Illness The patient is a 53 year old male who comes in for a preoperative History and Physical. The patient is scheduled for a right total knee arthroplasty to be performed by [name][facility]. The patient is a 53 year old male who presents for follow up of their knee. The patient is being followed for their right knee pain and osteoarthritis. They are now 2 year(s) (2 1/2 months) out from arthroscopy. Symptoms reported today include: pain (that is getting progressively worse over time), pain at night, swelling (that increases throughout the day), popping and grinding. The following medication has been used for pain control: none. When I last saw him we had done a series of Synvisc injections which did not help much. He is trying to go as long as possible without surgery. He is at a stage now however where the knee is hurting at all times. Definitely limiting what he can and can not do. He is working on modified duties and he said that even with modified duties the knee still hurts. He is not having any significant mechanical type symptoms. We discussed two years ago that total knee replacement was going to be in his future. Unfortunately his pain has gotten worse to the point where he is ready to go ahead and proceed with that now. Everything is discussed in detail today and he would like to proceed. They have been treated conservatively in the past for the above stated problem and despite conservative measures, they continue to have progressive pain and severe functional limitations and dysfunction. They have failed non-operative management including home exercise, medications, and injections. It is felt that they would benefit from undergoing total joint replacement. Risks and  benefits of the procedure have been discussed with the patient and they elect to proceed with surgery. There are no active contraindications to surgery such as ongoing infection or rapidly progressive neurological disease.   Allergies Penicillins. Rash, Hives.   Problem List/Past Medical Osteoarthritis, Knee (715.96) S/P arthroscopy of knee (V45.89) Gastroesophageal Reflux Disease High blood pressure Osteoarthritis Hypercholesterolemia Shingles Bronchitis. Past History Pneumonia. Past History Kidney Stone Scarlet Fever Mumps   Family History Heart disease in male family member before age 54 Hypertension. grandmother mothers side, grandfather mothers side, grandmother fathers side and grandfather fathers side Heart disease in male family member before age 54 Cancer. First Degree Relatives. mother Cerebrovascular Accident. grandmother fathers side and grandfather fathers side Heart Disease. father, grandmother mothers side, grandfather mothers side, grandmother fathers side and grandfather fathers side Diabetes Mellitus. grandmother mothers side    Social History Pain Contract. no Current work status. working full time Alcohol use. never consumed alcohol Previously in rehab. no Marital status. married Living situation. live with spouse Tobacco use. Never smoker. never smoker Drug/Alcohol Rehab (Currently). no Children. 2 Illicit drug use. no Exercise. 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 BuPROPion HCl ER (SR) (150MG  Tablet ER 12HR, Oral) Active. Crestor (20MG  Tablet, Oral) Active. Ibuprofen (200MG  Capsule, 1 (one) Oral) Active. (prn) Omeprazole (20MG  Tablet DR, Oral two times daily) Active. Multivital ( Oral) Active.    Past Surgical  History Neck Disc Surgery. Fusion Other Surgery.  RIGHT KNEE SURGERY, THUMB SURGERY TO SEW BACK ON.   Review of Systems General:Not Present- Chills, Fever, Night Sweats, Fatigue, Weight Gain, Weight Loss and Memory Loss. Skin:Not Present- Hives, Itching, Rash, Eczema and Lesions. HEENT:Not Present- Tinnitus, Headache, Double Vision, Visual Loss, Hearing Loss and Dentures. Respiratory:Present- Cough. Not Present- Shortness of breath with exertion, Shortness of breath at rest, Allergies, Coughing up blood and Chronic Cough. Cardiovascular:Not Present- Chest Pain, Racing/skipping heartbeats, Difficulty Breathing Lying Down, Murmur, Swelling and Palpitations. Gastrointestinal:Not Present- Bloody Stool, Heartburn, Abdominal Pain, Vomiting, Nausea, Constipation, Diarrhea, Difficulty Swallowing, Jaundice and Loss of appetitie. Male Genitourinary:Not Present- Urinary frequency, Blood in Urine, Weak urinary stream, Discharge, Flank Pain, Incontinence, Painful Urination, Urgency, Urinary Retention and Urinating at Night. Musculoskeletal:Present- Joint Pain. Not Present- Muscle Weakness, Muscle Pain, Joint Swelling, Back Pain, Morning Stiffness and Spasms. Neurological:Not Present- Tremor, Dizziness, Blackout spells, Paralysis, Difficulty with balance and Weakness. Psychiatric:Not Present- Insomnia.    Vitals Weight: 255 lb Height: 70 in Weight was reported by patient. Height was reported by patient. Body Surface Area: 2.39 m Body Mass Index: 36.59 kg/m BP: 144/80 (Sitting, Right Arm, Standard)     Physical Exam The physical exam findings are as follows:   General Mental Status - Alert, cooperative and good historian. General Appearance- pleasant. Not in acute distress. Orientation- Oriented X3. Build & Nutrition- Well nourished and Well developed.   Head and Neck Head- normocephalic, atraumatic . Neck Global Assessment- supple. no bruit auscultated  on the right and no bruit auscultated on the left.   Eye Pupil- Bilateral- Regular and Round. Motion- Bilateral- EOMI.   Chest and Lung Exam Auscultation: Breath sounds:- clear at anterior chest wall and - clear at posterior chest wall. Adventitious sounds:- No Adventitious sounds.   Cardiovascular Auscultation:Rhythm- Regular rate and rhythm. Heart Sounds- S1 WNL and S2 WNL. Murmurs & Other Heart Sounds:Auscultation of the heart reveals - No Murmurs.   Abdomen Palpation/Percussion:Tenderness- Abdomen is non-tender to palpation. Rigidity (guarding)- Abdomen is soft. Auscultation:Auscultation of the abdomen reveals - Bowel sounds normal.   Male Genitourinary  Not done, not pertinent to present illness  Musculoskeletal On exam he is alert and oriented in no apparent distress. His hips show normal range of motion, no discomfort. The left knee no effusion. Range about 5 to 125, slight crepitus on range of motion. No tenderness or instability. Right knee has a small effusion. His range of motion on the right is about 10 to 120. There is marked crepitus on range of motion. He is tender medially greater than laterally with no instability noted. Pulses, sensation and motor are intact.  RADIOGRAPHS: AP both knees and lateral of the right show he has significant bone on bone arthritis in the medial and patellofemoral compartments of the right knee.   Assessment & Plan Osteoarthritis, Knee (715.96) Impression: Right Knee  Note: Plan is for a Right Total Knee Replacement by Dr. Lequita Halt.  Plan is to go home.  PCP - Dr. Kari Baars  The patient does not have any contraindications and will receive TXA (tranexamic acid) prior to surgery.  Signed electronically by Beckey Rutter, III PA-C

## 2013-10-26 NOTE — Anesthesia Preprocedure Evaluation (Addendum)
Anesthesia Evaluation  Patient identified by MRN, date of birth, ID band Patient awake    Reviewed: Allergy & Precautions, H&P , NPO status , Patient's Chart, lab work & pertinent test results  Airway Mallampati: II TM Distance: >3 FB Neck ROM: Full    Dental  (+) Teeth Intact, Dental Advisory Given, Caps,    Pulmonary neg pulmonary ROS, former smoker,  breath sounds clear to auscultation  Pulmonary exam normal       Cardiovascular hypertension, Pt. on medications Rhythm:Regular Rate:Normal     Neuro/Psych negative neurological ROS  negative psych ROS   GI/Hepatic Neg liver ROS, GERD-  Medicated,  Endo/Other  negative endocrine ROSMorbid obesity  Renal/GU negative Renal ROS  negative genitourinary   Musculoskeletal negative musculoskeletal ROS (+)   Abdominal   Peds  Hematology negative hematology ROS (+)   Anesthesia Other Findings   Reproductive/Obstetrics                          Anesthesia Physical Anesthesia Plan  ASA: III  Anesthesia Plan: General   Post-op Pain Management:    Induction: Intravenous  Airway Management Planned: Oral ETT  Additional Equipment:   Intra-op Plan:   Post-operative Plan: Extubation in OR  Informed Consent: I have reviewed the patients History and Physical, chart, labs and discussed the procedure including the risks, benefits and alternatives for the proposed anesthesia with the patient or authorized representative who has indicated his/her understanding and acceptance.   Dental advisory given  Plan Discussed with: CRNA  Anesthesia Plan Comments:         Anesthesia Quick Evaluation

## 2013-10-26 NOTE — Interval H&P Note (Signed)
History and Physical Interval Note:  10/26/2013 6:49 AM  Tony Kingfisherobert L Aston  has presented today for surgery, with the diagnosis of OA OF RIGHT KNEE  The various methods of treatment have been discussed with the patient and family. After consideration of risks, benefits and other options for treatment, the patient has consented to  Procedure(s): RIGHT TOTAL KNEE ARTHROPLASTY (Right) as a surgical intervention .  The patient's history has been reviewed, patient examined, no change in status, stable for surgery.  I have reviewed the patient's chart and labs.  Questions were answered to the patient's satisfaction.     Loanne DrillingALUISIO,Christopher Glasscock V

## 2013-10-26 NOTE — Transfer of Care (Signed)
Immediate Anesthesia Transfer of Care Note  Patient: Tony KingfisherRobert L Newland  Procedure(s) Performed: Procedure(s): RIGHT TOTAL KNEE ARTHROPLASTY (Right)  Patient Location: PACU  Anesthesia Type:General  Level of Consciousness: awake, alert , oriented and patient cooperative  Airway & Oxygen Therapy: Patient Spontanous Breathing and Patient connected to face mask oxygen  Post-op Assessment: Report given to PACU RN, Post -op Vital signs reviewed and stable and Patient moving all extremities X 4  Post vital signs: Reviewed and stable  Complications: No apparent anesthesia complications

## 2013-10-26 NOTE — Op Note (Signed)
Pre-operative diagnosis- Osteoarthritis  Right knee(s)  Post-operative diagnosis- Osteoarthritis Right knee(s)  Procedure-  Right  Total Knee Arthroplasty  Surgeon- Gus RankinFrank V. Kimon Loewen, MD  Assistant- Leilani AbleSteve Chabon, PA-C   Anesthesia-  General  EBL-* No blood loss amount entered *   Drains Hemovac  Tourniquet time-  Total Tourniquet Time Documented: Thigh (Right) - 38 minutes Total: Thigh (Right) - 38 minutes     Complications- None  Condition-PACU - hemodynamically stable.   Brief Clinical Note  Tony KingfisherRobert L Santos is a 53 y.o. year old male with end stage OA of his right knee with progressively worsening pain and dysfunction. He has constant pain, with activity and at rest and significant functional deficits with difficulties even with ADLs. He has had extensive non-op management including analgesics, injections of cortisone and viscosupplements, and home exercise program, but remains in significant pain with significant dysfunction. Radiographs show bone on bone arthritis medial and patellofemoral compartments. He presents now for right Total Knee Arthroplasty.    Procedure in detail---   The patient is brought into the operating room and positioned supine on the operating table. After successful administration of  General,   a tourniquet is placed high on the  Right thigh(s) and the lower extremity is prepped and draped in the usual sterile fashion. Time out is performed by the operating team and then the  Right lower extremity is wrapped in Esmarch, knee flexed and the tourniquet inflated to 300 mmHg.       A midline incision is made with a ten blade through the subcutaneous tissue to the level of the extensor mechanism. A fresh blade is used to make a medial parapatellar arthrotomy. Soft tissue over the proximal medial tibia is subperiosteally elevated to the joint line with a knife and into the semimembranosus bursa with a Cobb elevator. Soft tissue over the proximal lateral tibia is  elevated with attention being paid to avoiding the patellar tendon on the tibial tubercle. The patella is everted, knee flexed 90 degrees and the ACL and PCL are removed. Findings are bone on bone medial and patellofemoral with massive global osteophytes.        The drill is used to create a starting hole in the distal femur and the canal is thoroughly irrigated with sterile saline to remove the fatty contents. The 5 degree Right  valgus alignment guide is placed into the femoral canal and the distal femoral cutting block is pinned to remove 9 mm off the distal femur. Resection is made with an oscillating saw.      The tibia is subluxed forward and the menisci are removed. The extramedullary alignment guide is placed referencing proximally at the medial aspect of the tibial tubercle and distally along the second metatarsal axis and tibial crest. The block is pinned to remove 2mm off the more deficient medial  side. Resection is made with an oscillating saw. Size 7is the most appropriate size for the tibia and the proximal tibia is prepared with the modular drill and keel punch for that size.      The femoral sizing guide is placed and size 7 is most appropriate. Rotation is marked off the epicondylar axis and confirmed by creating a rectangular flexion gap at 90 degrees. The size 7 cutting block is pinned in this rotation and the anterior, posterior and chamfer cuts are made with the oscillating saw. The intercondylar block is then placed and that cut is made.      Trial size 7 tibial  component, trial size 7 posterior stabilized femur and a 8  mm posterior stabilized rotating platform insert trial is placed. Full extension is achieved with excellent varus/valgus and anterior/posterior balance throughout full range of motion. The patella is everted and thickness measured to be 26  mm. Free hand resection is taken to 16 mm, a 38 template is placed, lug holes are drilled, trial patella is placed, and it tracks  normally. Osteophytes are removed off the posterior femur with the trial in place. All trials are removed and the cut bone surfaces prepared with pulsatile lavage. Cement is mixed and once ready for implantation, the size 7 tibial implant, size  7 posterior stabilized femoral component, and the size 38 patella are cemented in place and the patella is held with the clamp. The trial insert is placed and the knee held in full extension. The Exparel (20 ml mixed with 30 ml saline) and .25% Bupivicaine, are injected into the extensor mechanism, posterior capsule, medial and lateral gutters and subcutaneous tissues.  All extruded cement is removed and once the cement is hard the permanent 8 mm posterior stabilized rotating platform insert is placed into the tibial tray.      The wound is copiously irrigated with saline solution and the extensor mechanism closed over a hemovac drain with #1 V-loc suture. The tourniquet is released for a total tourniquet time of 38  minutes. Flexion against gravity is 140 degrees and the patella tracks normally. Subcutaneous tissue is closed with 2.0 vicryl and subcuticular with running 4.0 Monocryl. The incision is cleaned and dried and steri-strips and a bulky sterile dressing are applied. The limb is placed into a knee immobilizer and the patient is awakened and transported to recovery in stable condition.      Please note that a surgical assistant was a medical necessity for this procedure in order to perform it in a safe and expeditious manner. Surgical assistant was necessary to retract the ligaments and vital neurovascular structures to prevent injury to them and also necessary for proper positioning of the limb to allow for anatomic placement of the prosthesis.   [name], MD    10/26/2013, 10:26 AM

## 2013-10-26 NOTE — Anesthesia Postprocedure Evaluation (Signed)
Anesthesia Post Note  Patient: Tony KingfisherRobert L Santos  Procedure(s) Performed: Procedure(s) (LRB): RIGHT TOTAL KNEE ARTHROPLASTY (Right)  Anesthesia type: General  Patient location: PACU  Post pain: Pain level controlled  Post assessment: Post-op Vital signs reviewed  Last Vitals:  Filed Vitals:   10/26/13 1300  BP: 142/73  Pulse: 104  Temp: 36.6 C  Resp: 16    Post vital signs: Reviewed  Level of consciousness: sedated  Complications: No apparent anesthesia complications

## 2013-10-27 LAB — HEMOGLOBIN A1C
HEMOGLOBIN A1C: 8.9 % — AB (ref <5.7)
MEAN PLASMA GLUCOSE: 209 mg/dL — AB (ref <117)

## 2013-10-27 LAB — CBC
HCT: 41.9 % (ref 39.0–52.0)
Hemoglobin: 14 g/dL (ref 13.0–17.0)
MCH: 31 pg (ref 26.0–34.0)
MCHC: 33.4 g/dL (ref 30.0–36.0)
MCV: 92.7 fL (ref 78.0–100.0)
Platelets: 217 10*3/uL (ref 150–400)
RBC: 4.52 MIL/uL (ref 4.22–5.81)
RDW: 12.6 % (ref 11.5–15.5)
WBC: 15.6 10*3/uL — ABNORMAL HIGH (ref 4.0–10.5)

## 2013-10-27 LAB — BASIC METABOLIC PANEL
ANION GAP: 12 (ref 5–15)
BUN: 15 mg/dL (ref 6–23)
CO2: 27 mEq/L (ref 19–32)
CREATININE: 1.05 mg/dL (ref 0.50–1.35)
Calcium: 9.2 mg/dL (ref 8.4–10.5)
Chloride: 97 mEq/L (ref 96–112)
GFR, EST NON AFRICAN AMERICAN: 79 mL/min — AB (ref >90)
Glucose, Bld: 182 mg/dL — ABNORMAL HIGH (ref 70–99)
Potassium: 4.5 mEq/L (ref 3.7–5.3)
Sodium: 136 mEq/L — ABNORMAL LOW (ref 137–147)

## 2013-10-27 LAB — GLUCOSE, CAPILLARY
Glucose-Capillary: 153 mg/dL — ABNORMAL HIGH (ref 70–99)
Glucose-Capillary: 250 mg/dL — ABNORMAL HIGH (ref 70–99)
Glucose-Capillary: 250 mg/dL — ABNORMAL HIGH (ref 70–99)

## 2013-10-27 NOTE — Progress Notes (Signed)
Physical Therapy Treatment Patient Details Name: Tony KingfisherRobert L Santos MRN: [id number] DOB: 11/12/1960 Today's Date: 10/27/2013    History of Present Illness 53 yo male s/p R TKA 10/27/11.     PT Comments    Progressing well with mobility. Will plan to practice steps on tomorrow. Plan is for d/c home tomorrow per pt.   Follow Up Recommendations  Home health PT     Equipment Recommendations  Rolling walker with 5" wheels    Recommendations for Other Services OT consult     Precautions / Restrictions Precautions Precautions: Knee;Fall Required Braces or Orthoses: Knee Immobilizer - Right Knee Immobilizer - Right: Discontinue once straight leg raise with < 10 degree lag Restrictions Weight Bearing Restrictions: No    Mobility  Bed Mobility   Bed Mobility: Sit to Supine       Sit to supine: Min assist   General bed mobility comments: assist for R LE  Transfers Overall transfer level: Needs assistance Equipment used: Rolling walker (2 wheeled) Transfers: Sit to/from Stand Sit to Stand: Supervision         General transfer comment: vcs for UE/LE placement  Ambulation/Gait Ambulation/Gait assistance: Min guard Ambulation Distance (Feet): 150 Feet Assistive device: Rolling walker (2 wheeled) Gait Pattern/deviations: Step-to pattern;Wide base of support;Decreased stride length;Antalgic     General Gait Details: close guard for safety.    Stairs            Wheelchair Mobility    Modified Rankin (Stroke Patients Only)       Balance                                    Cognition                            Exercises      General Comments        Pertinent Vitals/Pain 6/10 with activity. Ice applied end of session    Home Living                      Prior Function            PT Goals (current goals can now be found in the care plan section) Progress towards PT goals: Progressing toward goals     Frequency  7X/week    PT Plan Current plan remains appropriate    Co-evaluation             End of Session   Activity Tolerance: Patient tolerated treatment well Patient left: in bed;with call bell/phone within reach;with family/visitor present     Time: 1450-1508 PT Time Calculation (min): 18 min  Charges:  $Gait Training: 8-22 mins                    G Codes:      Rebeca AlertJannie Amaia Lavallie, MPT Pager: [phone]

## 2013-10-27 NOTE — Progress Notes (Signed)
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

## 2013-10-27 NOTE — Progress Notes (Signed)
Physical Therapy Treatment Patient Details Name: Tony Santos MRN: [id number] DOB: 08/16/1960 Today's Date: 10/27/2013    History of Present Illness 53 yo male s/p R TKA 10/27/11.     PT Comments    Progressing well with mobility.   Follow Up Recommendations  Home health PT     Equipment Recommendations  Rolling walker with 5" wheels    Recommendations for Other Services OT consult     Precautions / Restrictions Precautions Precautions: Knee;Fall Required Braces or Orthoses: Knee Immobilizer - Right Knee Immobilizer - Right: Discontinue once straight leg raise with < 10 degree lag Restrictions Weight Bearing Restrictions: No    Mobility  Bed Mobility               General bed mobility comments: pt sitting in recliner  Transfers Overall transfer level: Needs assistance Equipment used: Rolling walker (2 wheeled) Transfers: Sit to/from Stand Sit to Stand: Min guard         General transfer comment: vcs for UE/LE placement  Ambulation/Gait Ambulation/Gait assistance: Min assist Ambulation Distance (Feet): 125 Feet Assistive device: Rolling walker (2 wheeled) Gait Pattern/deviations: Step-to pattern;Antalgic;Decreased stride length     General Gait Details: LOB x1 this session requiring Min assist to prevent fall. VCS safety, sequence, distance from walker, step length. slow gait speed with a few brief standing rest breaks.    Stairs            Wheelchair Mobility    Modified Rankin (Stroke Patients Only)       Balance                                    Cognition Arousal/Alertness: Awake/alert Behavior During Therapy: WFL for tasks assessed/performed Overall Cognitive Status: Within Functional Limits for tasks assessed                      Exercises Total Joint Exercises Ankle Circles/Pumps: AROM;Both;10 reps;Seated Quad Sets: AROM;Both;10 reps;Seated Hip ABduction/ADduction: AAROM;Right;10  reps;Seated Straight Leg Raises: AAROM;Right;10 reps;Seated Knee Flexion: AAROM;5 reps;Seated Goniometric ROM: 10-45 degrees seated    General Comments        Pertinent Vitals/Pain 5/10 R knee with activity. Ice applied end of session    Home Living Family/patient expects to be discharged to:: Private residence Living Arrangements: Spouse/significant other Available Help at Discharge: Family Type of Home: House Home Access: Stairs to enter Entrance Stairs-Rails: Right Home Layout: One level Home Equipment: None      Prior Function Level of Independence: Independent      Comments: wife will be with him 24/7 through weekend   PT Goals (current goals can now be found in the care plan section) Progress towards PT goals: Progressing toward goals    Frequency  7X/week    PT Plan Current plan remains appropriate    Co-evaluation             End of Session Equipment Utilized During Treatment: Gait belt Activity Tolerance: Patient tolerated treatment well Patient left: in chair;with call bell/phone within reach;with family/visitor present     Time: 0903-0928 PT Time Calculation (min): 25 min  Charges:  $Gait Training: 8-22 mins $Therapeutic Exercise: 8-22 mins                    G Codes:      Rebeca AlertJannie Edgel Degnan, MPT Pager: [phone]

## 2013-10-27 NOTE — Progress Notes (Signed)
[id number]/tct-one call for dme and hhc needs/information given to intake /one call id is [id number]/claim number for workers comp is [id number].  One call wcb with providers/dme rolling walker with 5 inch wheels and transfer tub bench requestede and approved to be delivered to patient room.

## 2013-10-27 NOTE — Evaluation (Signed)
Occupational Therapy Evaluation Patient Details Name: Tony Santos MRN: [id number] DOB: 04/04/1960 Today's Date: 10/27/2013    History of Present Illness 53 yo male s/p R TKA 10/26/13.    Clinical Impression   This 53 year old man was admitted for the above.  All education was completed.  No further OT is needed at this time.      Follow Up Recommendations  No OT follow up    Equipment Recommendations  3 in 1 bedside comode    Recommendations for Other Services       Precautions / Restrictions Precautions Precautions: Knee;Fall Required Braces or Orthoses: Knee Immobilizer - Right Knee Immobilizer - Right: Discontinue once straight leg raise with < 10 degree lag Restrictions Weight Bearing Restrictions: No      Mobility Bed Mobility                  Transfers       Sit to Stand: Min guard         General transfer comment: vcs for UE/LE placement    Balance                                            ADL Overall ADL's : Needs assistance/impaired     Grooming: Set up;Sitting   Upper Body Bathing: Set up;Sitting   Lower Body Bathing: Minimal assistance;Sit to/from stand   Upper Body Dressing : Set up;Sitting   Lower Body Dressing: Moderate assistance;Sit to/from stand   Toilet Transfer: Minimal assistance;Ambulation;RW   Toileting- Clothing Manipulation and Hygiene: Minimal assistance;Sit to/from stand   Tub/ Shower Transfer: Walk-in shower;Minimal assistance;3 in 1;Ambulation     General ADL Comments: Pt initially unsteady walking to bathroom--min A for balance; improved after walking 10 feet.  Educated on sidestepping for safety.  Pt would benefit from 3:1 for over commode and in shower.  Educated wife to dry legs if used in shower.  Pt has a Sports administratorreacher for outside.  Wife will assist for adls as needed. She will be home with him through the weekend.  Adjusted side stay on side of KI and educated on application/use.        Vision                     Perception     Praxis      Pertinent Vitals/Pain Initially 0; 5/10 R knee after ambulating     Hand Dominance     Extremity/Trunk Assessment Upper Extremity Assessment Upper Extremity Assessment: Overall WFL for tasks assessed           Communication Communication Communication: No difficulties   Cognition Arousal/Alertness: Awake/alert Behavior During Therapy: WFL for tasks assessed/performed Overall Cognitive Status: Within Functional Limits for tasks assessed                     General Comments       Exercises       Shoulder Instructions      Home Living Family/patient expects to be discharged to:: Private residence Living Arrangements: Spouse/significant other Available Help at Discharge: Family Type of Home: House Home Access: Stairs to enter Secretary/administratorntrance Stairs-Number of Steps: 3 Entrance Stairs-Rails: Right Home Layout: One level     Bathroom Shower/Tub: Walk-in shower;Tub/shower unit   Bathroom Toilet: Handicapped height     Home Equipment: None  Prior Functioning/Environment Level of Independence: Independent        Comments: wife will be with him 24/7 through weekend    OT Diagnosis:     OT Problem List:     OT Treatment/Interventions:      OT Goals(Current goals can be found in the care plan section)    OT Frequency:     Barriers to D/C:            Co-evaluation              End of Session CPM Right Knee CPM Right Knee: Off  Activity Tolerance: Patient tolerated treatment well Patient left: in chair;with call bell/phone within reach;with family/visitor present   Time: 0757-0825 OT Time Calculation (min): 28 min Charges:  OT General Charges $OT Visit: 1 Procedure OT Evaluation $Initial OT Evaluation Tier I: 1 Procedure OT Treatments $Self Care/Home Management : 8-22 mins G-Codes:    Tony Santos 2013/11/17, 8:34 AM Tony Santos,  OTR/L [phone] 11/17/2013

## 2013-10-27 NOTE — Progress Notes (Signed)
Rn received called from [employer] care management at 1700.   "Geoffery SpruceLeAnn" was asking questions of RN. RN went to patients room to ask about sharing information with this company.  Patients RN requested that they call Bjorn LoserRhonda, CM back between the hours of 8am, and 12pm on 7/29. Rhonda consulted with this agency. Therefore, RN requested that they discuss this situation with Bjorn Loserhonda, CM.

## 2013-10-27 NOTE — Progress Notes (Signed)
Nutrition Consult Note   RD consulted for nutrition education regarding diabetic diet.   RD provided "General Healthy Eating" handout from the Academy of [employer]. Also discussed different food groups and their effects on blood sugar, emphasizing carbohydrate-containing foods.   Pt states his grandfather has diabetes. Pt admits to not watching his carbohydrate intake at home. Started trying to eat healthier over the weekend and cut back on sweets and avoid sodas.   Discussed importance of controlled and consistent carbohydrate intake throughout the day. Provided examples of ways to balance meals/snacks and encouraged intake of high-fiber, whole grain complex carbohydrates. Teach back method used.  Expect good compliance. Wife provided support and encouragement for diet changes.   Body mass index is 38.6 kg/(m^2). Pt meets criteria for class II obesity based on current BMI.  Current diet order is CHO modified, patient is consuming approximately 100% of meals at this time. Labs and medications reviewed. No further nutrition interventions warranted at this time. RD contact information provided. If additional nutrition issues arise, please re-consult RD.  Tony RakesHeather Yena Tisby MS, RD, LDN [phone] Pager [phone] Weekend/After Hours Pager

## 2013-10-27 NOTE — Progress Notes (Addendum)
   Subjective: 1 Day Post-Op Procedure(s) (LRB): RIGHT TOTAL KNEE ARTHROPLASTY (Right) Patient reports pain as mild.   Patient seen in rounds with Dr. Lequita HaltAluisio. Not much sleep Patient is well, but has had some minor complaints of pain in the knee, requiring pain medications We will start therapy today.  Plan is to go Home after hospital stay.  Objective: Vital signs in last 24 hours: Temp:  [97.8 F (36.6 C)-98.7 F (37.1 C)] 98.6 F (37 C) (07/28 0702) Pulse Rate:  [83-109] 83 (07/28 0702) Resp:  [10-17] 17 (07/28 0702) BP: (131-177)/(68-94) 142/91 mmHg (07/28 0702) SpO2:  [92 %-99 %] 97 % (07/28 0702)  Intake/Output from previous day:  Intake/Output Summary (Last 24 hours) at 10/27/13 0717 Last data filed at 10/27/13 0703  Gross per 24 hour  Intake   3480 ml  Output   3385 ml  Net     95 ml    Intake/Output this shift: Total I/O In: -  Out: 480 [Urine:400; Drains:80]  Labs:  Recent Labs  10/27/13 0424  HGB 14.0    Recent Labs  10/27/13 0424  WBC 15.6*  RBC 4.52  HCT 41.9  PLT 217    Recent Labs  10/27/13 0424  NA 136*  K 4.5  CL 97  CO2 27  BUN 15  CREATININE 1.05  GLUCOSE 182*  CALCIUM 9.2   No results found for this basename: LABPT, INR,  in the last 72 hours  EXAM General - Patient is Alert, Appropriate and Oriented Extremity - Neurovascular intact Sensation intact distally Dorsiflexion/Plantar flexion intact Dressing - dressing C/D/I Motor Function - intact, moving foot and toes well on exam.  Hemovac pulled without difficulty.  Past Medical History  Diagnosis Date  . GERD (gastroesophageal reflux disease)   . Hypertension   . Hypercholesterolemia   . Shingles     ABOUT 3 YRS AGO-NO RESIDUAL PROBLEMS FROM THE SHINGLES  . Pneumonia     IN THE PAST  . Bronchitis     IN THE PAST  . History of kidney stones   . Pain     LEFT SHOULDER PAIN - RANGE OFMOTION OK  . Arthritis     OA  / PAIN RIGHT KNEE    Assessment/Plan: 1 Day  Post-Op Procedure(s) (LRB): RIGHT TOTAL KNEE ARTHROPLASTY (Right) Principal Problem:   OA (osteoarthritis) of knee  Estimated body mass index is 38.6 kg/(m^2) as calculated from the following:   Height as of this encounter: 5\' 10"  (1.778 m).   Weight as of this encounter: 122.018 kg (269 lb). Up with therapy Plan for discharge tomorrow Discharge home with home health  DVT Prophylaxis - Xarelto Weight-Bearing as tolerated to right leg D/C O2 and Pulse OX and try on Room Air  Avel Peacerew Perkins, PA-C Orthopaedic Surgery 10/27/2013, 7:17 AM  Addendum - Discussed the elevated glucose levels with the patient and his wife this morning.  Will check a HGB A1C and will also get Diabetic Teaching Services for some education and diet management.  Will have him follow up with Dr. Juanetta GoslingHawkins following discharge from the hospital stay.

## 2013-10-27 NOTE — Discharge Instructions (Addendum)
° °Dr. Frank Aluisio °Total Joint Specialist °[facility] °[address] °, [place] [address] °[phone] ° °TOTAL KNEE REPLACEMENT POSTOPERATIVE DIRECTIONS ° ° ° °Knee Rehabilitation, Guidelines Following Surgery  °Results after knee surgery are often greatly improved when you follow the exercise, range of motion and muscle strengthening exercises prescribed by your doctor. Safety measures are also important to protect the knee from further injury. Any time any of these exercises cause you to have increased pain or swelling in your knee joint, decrease the amount until you are comfortable again and slowly increase them. If you have problems or questions, call your caregiver or physical therapist for advice.  ° °HOME CARE INSTRUCTIONS  °Remove items at home which could result in a fall. This includes throw rugs or furniture in walking pathways.  °Continue medications as instructed at time of discharge. °You may have some home medications which will be placed on hold until you complete the course of blood thinner medication.  °You may start showering once you are discharged home but do not submerge the incision under water. Just pat the incision dry and apply a dry gauze dressing on daily. °Walk with walker as instructed.  °You may resume a sexual relationship in one month or when given the OK by  your doctor.  °· Use walker as long as suggested by your caregivers. °· Avoid periods of inactivity such as sitting longer than an hour when not asleep. This helps prevent blood clots.  °You may put full weight on your legs and walk as much as is comfortable.  °You may return to work once you are cleared by your doctor.  °Do not drive a car for 6 weeks or until released by you surgeon.  °· Do not drive while taking narcotics.  °Wear the elastic stockings for three weeks following surgery during the day but you may remove then at night. °Make sure you keep all of your appointments after your  operation with all of your doctors and caregivers. You should call the office at the above phone number and make an appointment for approximately two weeks after the date of your surgery. °Change the dressing daily and reapply a dry dressing each time. °Please pick up a stool softener and laxative for home use as long as you are requiring pain medications. °· Continue to use ice on the knee for pain and swelling from surgery. You may notice swelling that will progress down to the foot and ankle.  This is normal after surgery.  Elevate the leg when you are not up walking on it.   °It is important for you to complete the blood thinner medication as prescribed by your doctor. °· Continue to use the breathing machine which will help keep your temperature down.  It is common for your temperature to cycle up and down following surgery, especially at night when you are not up moving around and exerting yourself.  The breathing machine keeps your lungs expanded and your temperature down. ° °RANGE OF MOTION AND STRENGTHENING EXERCISES  °Rehabilitation of the knee is important following a knee injury or an operation. After just a few days of immobilization, the muscles of the thigh which control the knee become weakened and shrink (atrophy). Knee exercises are designed to build up the tone and strength of the thigh muscles and to improve knee motion. Often times heat used for twenty to thirty minutes before working out will loosen up your tissues and help with improving the   range of motion but do not use heat for the first two weeks following surgery. These exercises can be done on a training (exercise) mat, on the floor, on a table or on a bed. Use what ever works the best and is most comfortable for you Knee exercises include:  °Leg Lifts - While your knee is still immobilized in a splint or cast, you can do straight leg raises. Lift the leg to 60 degrees, hold for 3 sec, and slowly lower the leg. Repeat 10-20 times 2-3  times daily. Perform this exercise against resistance later as your knee gets better.  °Quad and Hamstring Sets - Tighten up the muscle on the front of the thigh (Quad) and hold for 5-10 sec. Repeat this 10-20 times hourly. Hamstring sets are done by pushing the foot backward against an object and holding for 5-10 sec. Repeat as with quad sets.  °A rehabilitation program following serious knee injuries can speed recovery and prevent re-injury in the future due to weakened muscles. Contact your doctor or a physical therapist for more information on knee rehabilitation.  ° °SKILLED REHAB INSTRUCTIONS: °If the patient is transferred to a skilled rehab facility following release from the hospital, a list of the current medications will be sent to the facility for the patient to continue.  When discharged from the skilled rehab facility, please have the facility set up the patient's Home Health Physical Therapy prior to being released. Also, the skilled facility will be responsible for providing the patient with their medications at time of release from the facility to include their pain medication, the muscle relaxants, and their blood thinner medication. If the patient is still at the rehab facility at time of the two week follow up appointment, the skilled rehab facility will also need to assist the patient in arranging follow up appointment in our office and any transportation needs. ° °MAKE SURE YOU:  °Understand these instructions.  °Will watch your condition.  °Will get help right away if you are not doing well or get worse.  ° ° °Pick up stool softner and laxative for home. °Do not submerge incision under water. °May shower. °Continue to use ice for pain and swelling from surgery. ° °Take Xarelto for two and a half more weeks, then discontinue Xarelto. °Once the patient has completed the blood thinner regimen, then take a Baby 81 mg Aspirin daily for three more weeks. ° °Information on my medicine - XARELTO®  (Rivaroxaban) ° °This medication education was reviewed with me or my healthcare representative as part of my discharge preparation.  The pharmacist that spoke with me during my hospital stay was:  Jackson, Rachel E, RPH ° °Why was Xarelto® prescribed for you? °Xarelto® was prescribed for you to reduce the risk of blood clots forming after orthopedic surgery. The medical term for these abnormal blood clots is venous thromboembolism (VTE). ° °What do you need to know about xarelto® ? °Take your Xarelto® ONCE DAILY at the same time every day. °You may take it either with or without food. ° °If you have difficulty swallowing the tablet whole, you may crush it and mix in applesauce just prior to taking your dose. ° °Take Xarelto® exactly as prescribed by your doctor and DO NOT stop taking Xarelto® without talking to the doctor who prescribed the medication.  Stopping without other VTE prevention medication to take the place of Xarelto® may increase your risk of developing a clot. ° °After discharge, you should have regular check-up appointments   with your healthcare provider that is prescribing your Xarelto®.   ° °What do you do if you miss a dose? °If you miss a dose, take it as soon as you remember on the same day then continue your regularly scheduled once daily regimen the next day. Do not take two doses of Xarelto® on the same day.  ° °Important Safety Information °A possible side effect of Xarelto® is bleeding. You should call your healthcare provider right away if you experience any of the following: °  Bleeding from an injury or your nose that does not stop. °  Unusual colored urine (red or dark brown) or unusual colored stools (red or black). °  Unusual bruising for unknown reasons. °  A serious fall or if you hit your head (even if there is no bleeding). ° °Some medicines may interact with Xarelto® and might increase your risk of bleeding while on Xarelto®. To help avoid this, consult your healthcare provider  or pharmacist prior to using any new prescription or non-prescription medications, including herbals, vitamins, non-steroidal anti-inflammatory drugs (NSAIDs) and supplements. ° °This website has more information on Xarelto®: www.xarelto.com. ° ° °

## 2013-10-27 NOTE — Progress Notes (Signed)
Inpatient Diabetes Program Recommendations  AACE/ADA: New Consensus Statement on Inpatient Glycemic Control (2013)  Target Ranges:  Prepandial:   less than 140 mg/dL      Peak postprandial:   less than 180 mg/dL (1-2 hours)      Critically ill patients:  140 - 180 mg/dL     Results for Tony Santos, Tony Santos (MRN [id number]) as of 10/27/2013 11:51  Ref. Range 10/26/2013 08:42 10/26/2013 12:35 10/26/2013 17:45 10/26/2013 20:53  Glucose-Capillary Latest Range: 70-99 mg/dL 657185 (H) 846240 (H) 962288 (H) 259 (H)     Patient POD #1 R Total Knee.  No History of DM mentioned in H&P.  Patient did receive 2 doses of steroids but is having unusually elevated CBG levels.  Received referral from Ortho PA to see patient and discuss possibility of DM diagnosis.  Note that A1c ordered for this patient.    **Spoke with patient about the possibility that he may have DM.  Discussed with patient and his wife what an A1c test is and what it measures.  Explained that an A1c of 6.5% or greater will be a positive diagnosis of DM and that an A1c of 5.7% to 6.4% is an indicator for "pre-DM" or simply put that patient is at a very high risk for DM.  Discussed basic pathophysiology of Type 2 DM, basic home care, basic nutritional concepts, etc.  Encouraged patient to eliminate beverages with sugar and high fructose corn syrup from his diet and to drink mostly water and non sugar containing beverages.  Also encouraged patient to monitor portion sizes and limit desserts to a few times per week.  Patient stated he drinks a lot of regular soda (coke, mountain dew, etc) and will be happy to give them up to help his blood sugars.  Reviewed basic carbohydrate counting information with pt and his wife and showed them how to read a food label.  **Encouraged patient to check his MyChart account after discharge to check his A1c results if that test does not result before his d/c home tomorrow.  Asked patient to please follow up with his PCP Dr.  Juanetta GoslingHawkins about his A1c results.  Patient and wife very agreeable to all the information I provided and appreciative of my visit.  Gave patient and his wife educational handouts on Type 2 DM and carbohydrate counting guidelines.  RD to see pt today to provide more in-depth dietary education to patient.    Will follow Ambrose FinlandJeannine Johnston Vertis Scheib RN, MSN, CDE Diabetes Coordinator Inpatient Diabetes Program Team Pager: [phone] (8a-10p)

## 2013-10-28 DIAGNOSIS — R739 Hyperglycemia, unspecified: Secondary | ICD-10-CM | POA: Diagnosis present

## 2013-10-28 DIAGNOSIS — E871 Hypo-osmolality and hyponatremia: Secondary | ICD-10-CM | POA: Diagnosis not present

## 2013-10-28 LAB — CBC
HEMATOCRIT: 42.8 % (ref 39.0–52.0)
Hemoglobin: 14.3 g/dL (ref 13.0–17.0)
MCH: 31.4 pg (ref 26.0–34.0)
MCHC: 33.4 g/dL (ref 30.0–36.0)
MCV: 93.9 fL (ref 78.0–100.0)
Platelets: 232 10*3/uL (ref 150–400)
RBC: 4.56 MIL/uL (ref 4.22–5.81)
RDW: 12.9 % (ref 11.5–15.5)
WBC: 18.3 10*3/uL — ABNORMAL HIGH (ref 4.0–10.5)

## 2013-10-28 LAB — GLUCOSE, CAPILLARY
GLUCOSE-CAPILLARY: 224 mg/dL — AB (ref 70–99)
Glucose-Capillary: 211 mg/dL — ABNORMAL HIGH (ref 70–99)
Glucose-Capillary: 208 mg/dL — ABNORMAL HIGH (ref 70–99)

## 2013-10-28 LAB — BASIC METABOLIC PANEL
ANION GAP: 13 (ref 5–15)
BUN: 18 mg/dL (ref 6–23)
CALCIUM: 9.2 mg/dL (ref 8.4–10.5)
CHLORIDE: 95 meq/L — AB (ref 96–112)
CO2: 27 meq/L (ref 19–32)
Creatinine, Ser: 0.97 mg/dL (ref 0.50–1.35)
GFR calc Af Amer: >90 mL/min (ref >90)
GFR calc non Af Amer: >90 mL/min (ref >90)
Glucose, Bld: 212 mg/dL — ABNORMAL HIGH (ref 70–99)
Potassium: 4.1 mEq/L (ref 3.7–5.3)
Sodium: 135 mEq/L — ABNORMAL LOW (ref 137–147)

## 2013-10-28 MED ORDER — METHOCARBAMOL 500 MG PO TABS: 500.0000 mg | ORAL_TABLET | Freq: Four times a day (QID) | ORAL | Status: DC | PRN

## 2013-10-28 MED ORDER — LIVING WELL WITH DIABETES BOOK
Freq: Once | Status: AC
  Administered 2013-10-28: 10:00:00
  Filled 2013-10-28: qty 1

## 2013-10-28 MED ORDER — RIVAROXABAN 10 MG PO TABS: 10.0000 mg | ORAL_TABLET | Freq: Every day | ORAL | Status: DC

## 2013-10-28 MED ORDER — TRAMADOL HCL 50 MG PO TABS: 50.0000 mg | ORAL_TABLET | Freq: Four times a day (QID) | ORAL | Status: DC | PRN

## 2013-10-28 MED ORDER — OXYCODONE HCL 5 MG PO TABS: 5.0000 mg | ORAL_TABLET | ORAL | Status: AC | PRN

## 2013-10-28 NOTE — Progress Notes (Signed)
Physical Therapy Treatment Patient Details Name: Tony KingfisherRobert L Santos MRN: [id number] DOB: 07/18/1960 Today's Date: 10/28/2013    History of Present Illness 53 yo male s/p R TKA 10/27/11.     PT Comments    Progressing well with mobility. Practiced steps, ambulation, exercises. All education completed. Ready to d/c from PT standpoint.   Follow Up Recommendations  Home health PT     Equipment Recommendations  Rolling walker with 5" wheels    Recommendations for Other Services OT consult     Precautions / Restrictions Precautions Precautions: Knee;Fall Required Braces or Orthoses: Knee Immobilizer - Right Knee Immobilizer - Right: Discontinue once straight leg raise with < 10 degree lag Restrictions Weight Bearing Restrictions: No    Mobility  Bed Mobility Overal bed mobility: Needs Assistance Bed Mobility: Supine to Sit     Supine to sit: Min assist     General bed mobility comments: assist for R LE  Transfers Overall transfer level: Needs assistance Equipment used: Rolling walker (2 wheeled) Transfers: Sit to/from Stand Sit to Stand: Supervision         General transfer comment: vcs for UE/LE placement  Ambulation/Gait Ambulation/Gait assistance: Min guard Ambulation Distance (Feet): 125 Feet Assistive device: Rolling walker (2 wheeled) Gait Pattern/deviations: Step-to pattern;Wide base of support;Decreased stride length;Antalgic     General Gait Details: close guard for safety.    Stairs Stairs: Yes   Stair Management: Step to pattern;Forwards;With crutches;One rail Right Number of Stairs: 2 General stair comments: VCs safety, technique, sequence. close guard for safety.   Wheelchair Mobility    Modified Rankin (Stroke Patients Only)       Balance                                    Cognition Arousal/Alertness: Awake/alert Behavior During Therapy: WFL for tasks assessed/performed Overall Cognitive Status: Within Functional  Limits for tasks assessed                      Exercises Total Joint Exercises Ankle Circles/Pumps: AROM Quad Sets: AROM;Both;10 reps;Seated Heel Slides: AAROM;Right;10 reps;Supine Hip ABduction/ADduction: AAROM;Right;10 reps;Seated Straight Leg Raises: AAROM;Right;10 reps;Seated Goniometric ROM: 10-45    General Comments        Pertinent Vitals/Pain 5/10 Rknee with activity. Ice applied end of session    Home Living                      Prior Function            PT Goals (current goals can now be found in the care plan section) Progress towards PT goals: Progressing toward goals    Frequency  7X/week    PT Plan Current plan remains appropriate    Co-evaluation             End of Session Equipment Utilized During Treatment: Gait belt Activity Tolerance: Patient tolerated treatment well Patient left: in chair;with call bell/phone within reach;with family/visitor present     Time: [id number] PT Time Calculation (min): 41 min  Charges:  $Gait Training: 23-37 mins $Therapeutic Exercise: 8-22 mins                    G Codes:      Rebeca AlertJannie Harman Ferrin, MPT Pager: [phone]

## 2013-10-28 NOTE — Progress Notes (Signed)
tct-one call attempting to find out who the vendor for [facility] is /[facility]/rhonda Davis,RN,BSn,CCM

## 2013-10-28 NOTE — Progress Notes (Signed)
Spoke with kim of one call this am at 0845-will deliver dme to room this am Spoke with [facility] anya this am notified of pt need and faxed orders. Orders refaxed at 1423 to [phone] confirmation received.

## 2013-10-28 NOTE — Progress Notes (Signed)
RN reviewed discharge instructions with patient and family. Patient stated understanding.   All questions answered.   NT rolled patient down in wheelchair to family car.

## 2013-10-28 NOTE — Progress Notes (Signed)
Inpatient Diabetes Program Recommendations  AACE/ADA: New Consensus Statement on Inpatient Glycemic Control (2013)  Target Ranges:  Prepandial:   less than 140 mg/dL      Peak postprandial:   less than 180 mg/dL (1-2 hours)      Critically ill patients:  140 - 180 mg/dL     Results for Tony KingfisherSHELTON, Kendall Santos (MRN [id number]) as of 10/28/2013 10:18  Ref. Range 10/27/2013 07:23 10/27/2013 12:09 10/27/2013 17:33 10/27/2013 22:54  Glucose-Capillary Latest Range: 70-99 mg/dL 409153 (H) 811250 (H) 914250 (H) 211 (H)    Results for Tony KingfisherSHELTON, Tony Santos (MRN [id number]) as of 10/28/2013 10:18  Ref. Range 10/27/2013 04:24  Hemoglobin A1C Latest Range: <5.7 % 8.9 (H)     **A1c of 8.9% indicative of positive diagnosis of Diabetes.  Spoke to patient about his current A1c of 8.9%.  Reviewed again today with patient and his wife what an A1c is and what it measures.  Reminded patient that his goal A1c is 7% or less per ADA standards to prevent both acute and long-term complications.  Encouraged patient to check his CBGs at least daily at home (vary the time of day he checks to get a variety of readings) and to record all CBGs in a logbook for his PCP to review.  Encouraged patient to immediately follow up with his PCP Dr. Juanetta GoslingHawkins after discharge to review new diagnosis of DM.  Explained to pt and his wife that they need to ask PCP for a CBG meter Rx and they also need to ask PCP if patient needs to start oral DM medications for home.  Discussed various oral medications available.  Also discussed checking CBGs at home.  Reminded patient to wash hands before checking CBGs (or use alcohol rub and allow to dry) and to rotate fingerstick sites as much as possible.  **Again encouraged patient to avoid beverages with sugar and high fructose corn syrup and to be careful with his portion sizes of carbohydrate containing foods.  **Have asked RN caring for patient to please allow patient to practice checking his fingerstick glucose at least  once before d/c.  Have also ordered Living Well with Diabetes educational booklet for patient from pharmacy.     Will follow Ambrose FinlandJeannine Johnston Albirtha Grinage RN, MSN, CDE Diabetes Coordinator Inpatient Diabetes Program Team Pager: [phone] (8a-10p)

## 2013-10-28 NOTE — Progress Notes (Signed)
   Subjective: 2 Days Post-Op Procedure(s) (LRB): RIGHT TOTAL KNEE ARTHROPLASTY (Right) Patient reports pain as mild.   Patient seen in rounds for Dr. Lequita HaltAluisio. Patient is well, and has had no acute complaints or problems Patient is ready to go home  Objective: Vital signs in last 24 hours: Temp:  [98.6 F (37 C)-99.4 F (37.4 C)] 98.8 F (37.1 C) (07/29 0536) Pulse Rate:  [94-100] 100 (07/29 0536) Resp:  [14-19] 14 (07/29 1125) BP: (160-175)/(84-88) 168/88 mmHg (07/29 0536) SpO2:  [92 %-94 %] 93 % (07/29 0536)  Intake/Output from previous day:  Intake/Output Summary (Last 24 hours) at 10/28/13 1211 Last data filed at 10/28/13 0911  Gross per 24 hour  Intake   1046 ml  Output   1725 ml  Net   -679 ml    Intake/Output this shift: Total I/O In: 240 [P.O.:240] Out: -   Labs:  Recent Labs  10/27/13 0424 10/28/13 0433  HGB 14.0 14.3    Recent Labs  10/27/13 0424 10/28/13 0433  WBC 15.6* 18.3*  RBC 4.52 4.56  HCT 41.9 42.8  PLT 217 232    Recent Labs  10/27/13 0424 10/28/13 0433  NA 136* 135*  K 4.5 4.1  CL 97 95*  CO2 27 27  BUN 15 18  CREATININE 1.05 0.97  GLUCOSE 182* 212*  CALCIUM 9.2 9.2   No results found for this basename: LABPT, INR,  in the last 72 hours  EXAM: General - Patient is Alert, Appropriate and Oriented Extremity - Neurovascular intact Sensation intact distally Dorsiflexion/Plantar flexion intact Incision - clean, dry, no drainage, healing Motor Function - intact, moving foot and toes well on exam.   Assessment/Plan: 2 Days Post-Op Procedure(s) (LRB): RIGHT TOTAL KNEE ARTHROPLASTY (Right) Procedure(s) (LRB): RIGHT TOTAL KNEE ARTHROPLASTY (Right) Past Medical History  Diagnosis Date  . GERD (gastroesophageal reflux disease)   . Hypertension   . Hypercholesterolemia   . Shingles     ABOUT 3 YRS AGO-NO RESIDUAL PROBLEMS FROM THE SHINGLES  . Pneumonia     IN THE PAST  . Bronchitis     IN THE PAST  . History of kidney  stones   . Pain     LEFT SHOULDER PAIN - RANGE OFMOTION OK  . Arthritis     OA  / PAIN RIGHT KNEE   Principal Problem:   OA (osteoarthritis) of knee  Estimated body mass index is 38.6 kg/(m^2) as calculated from the following:   Height as of this encounter: 5\' 10"  (1.778 m).   Weight as of this encounter: 122.018 kg (269 lb). Up with therapy Discharge home with home health Diet - Modified carb diet Follow up - in 2 weeks with Dr. Lequita HaltAluisio Activity - WBAT Disposition - Home Condition Upon Discharge - Good D/C Meds - See DC Summary DVT Prophylaxis - Xarelto  Avel Peacerew Auda Finfrock, PA-C Orthopaedic Surgery 10/28/2013, 12:11 PM

## 2013-10-28 NOTE — Discharge Summary (Signed)
Physician Discharge Summary   Patient ID: Tony Santos MRN: [id number] DOB/AGE: 1960/11/21 53 y.o.  Admit date: 10/26/2013 Discharge date: 10-28-2013  Primary Diagnosis:  Osteoarthritis Right knee(s)  Admission Diagnoses:  Past Medical History  Diagnosis Date  . GERD (gastroesophageal reflux disease)   . Hypertension   . Hypercholesterolemia   . Shingles     ABOUT 3 YRS AGO-NO RESIDUAL PROBLEMS FROM THE SHINGLES  . Pneumonia     IN THE PAST  . Bronchitis     IN THE PAST  . History of kidney stones   . Pain     LEFT SHOULDER PAIN - RANGE OFMOTION OK  . Arthritis     OA  / PAIN RIGHT KNEE   Discharge Diagnoses:   Principal Problem:   OA (osteoarthritis) of knee Active Problems:   Hyperglycemia   Postop Hyponatremia  Estimated body mass index is 38.6 kg/(m^2) as calculated from the following:   Height as of this encounter: '5\' 10"'  (1.778 m).   Weight as of this encounter: 122.018 kg (269 lb).  Procedure:  Procedure(s) (LRB): RIGHT TOTAL KNEE ARTHROPLASTY (Right)   Consults: None  HPI: Tony Santos is a 53 y.o. year old male with end stage OA of his right knee with progressively worsening pain and dysfunction. He has constant pain, with activity and at rest and significant functional deficits with difficulties even with ADLs. He has had extensive non-op management including analgesics, injections of cortisone and viscosupplements, and home exercise program, but remains in significant pain with significant dysfunction. Radiographs show bone on bone arthritis medial and patellofemoral compartments. He presents now for right Total Knee Arthroplasty.   Laboratory Data: Admission on 10/26/2013  Component Date Value Ref Range Status  . Glucose-Capillary 10/26/2013 175* 70 - 99 mg/dL Final  . Comment 1 10/26/2013 Notify RN   Final  . Glucose-Capillary 10/26/2013 185* 70 - 99 mg/dL Final  . Glucose-Capillary 10/26/2013 240* 70 - 99 mg/dL Final  . WBC 10/27/2013 15.6* 4.0  - 10.5 K/uL Final  . RBC 10/27/2013 4.52  4.22 - 5.81 MIL/uL Final  . Hemoglobin 10/27/2013 14.0  13.0 - 17.0 g/dL Final  . HCT 10/27/2013 41.9  39.0 - 52.0 % Final  . MCV 10/27/2013 92.7  78.0 - 100.0 fL Final  . MCH 10/27/2013 31.0  26.0 - 34.0 pg Final  . MCHC 10/27/2013 33.4  30.0 - 36.0 g/dL Final  . RDW 10/27/2013 12.6  11.5 - 15.5 % Final  . Platelets 10/27/2013 217  150 - 400 K/uL Final  . Sodium 10/27/2013 136* 137 - 147 mEq/L Final  . Potassium 10/27/2013 4.5  3.7 - 5.3 mEq/L Final  . Chloride 10/27/2013 97  96 - 112 mEq/L Final  . CO2 10/27/2013 27  19 - 32 mEq/L Final  . Glucose, Bld 10/27/2013 182* 70 - 99 mg/dL Final  . BUN 10/27/2013 15  6 - 23 mg/dL Final  . Creatinine, Ser 10/27/2013 1.05  0.50 - 1.35 mg/dL Final  . Calcium 10/27/2013 9.2  8.4 - 10.5 mg/dL Final  . GFR calc non Af Amer 10/27/2013 79* >90 mL/min Final  . GFR calc Af Amer 10/27/2013 >90  >90 mL/min Final   Comment: (NOTE)                          The eGFR has been calculated using the CKD EPI equation.  This calculation has not been validated in all clinical situations.                          eGFR's persistently <90 mL/min signify possible Chronic Kidney                          Disease.  . Anion gap 10/27/2013 12  5 - 15 Final  . Glucose-Capillary 10/26/2013 288* 70 - 99 mg/dL Final  . Glucose-Capillary 10/26/2013 259* 70 - 99 mg/dL Final  . Comment 1 10/26/2013 Notify RN   Final  . Hemoglobin A1C 10/27/2013 8.9* <5.7 % Final   Comment: (NOTE)                                                                                                                         According to the ADA Clinical Practice Recommendations for 2011, when                          HbA1c is used as a screening test:                           >=6.5%   Diagnostic of Diabetes Mellitus                                    (if abnormal result is confirmed)                          5.7-6.4%   Increased risk  of developing Diabetes Mellitus                          References:Diagnosis and Classification of Diabetes Mellitus,Diabetes                          WUJW,1191,47(WGNFA 1):S62-S69 and Standards of Medical Care in                                  Diabetes - 2011,Diabetes Care,2011,34 (Suppl 1):S11-S61.  . Mean Plasma Glucose 10/27/2013 209* <117 mg/dL Final   Performed at [facility]  . Glucose-Capillary 10/27/2013 153* 70 - 99 mg/dL Final  . Comment 1 10/27/2013 Notify RN   Final  . Comment 2 10/27/2013 Documented in Chart   Final  . Glucose-Capillary 10/27/2013 250* 70 - 99 mg/dL Final  . Comment 1 10/27/2013 Notify RN   Final  . Comment 2 10/27/2013 Documented in Chart   Final  . WBC 10/28/2013 18.3* 4.0 - 10.5 K/uL Final  . RBC 10/28/2013 4.56  4.22 - 5.81 MIL/uL Final  . Hemoglobin 10/28/2013  14.3  13.0 - 17.0 g/dL Final  . HCT 10/28/2013 42.8  39.0 - 52.0 % Final  . MCV 10/28/2013 93.9  78.0 - 100.0 fL Final  . MCH 10/28/2013 31.4  26.0 - 34.0 pg Final  . MCHC 10/28/2013 33.4  30.0 - 36.0 g/dL Final  . RDW 10/28/2013 12.9  11.5 - 15.5 % Final  . Platelets 10/28/2013 232  150 - 400 K/uL Final  . Sodium 10/28/2013 135* 137 - 147 mEq/L Final  . Potassium 10/28/2013 4.1  3.7 - 5.3 mEq/L Final  . Chloride 10/28/2013 95* 96 - 112 mEq/L Final  . CO2 10/28/2013 27  19 - 32 mEq/L Final  . Glucose, Bld 10/28/2013 212* 70 - 99 mg/dL Final  . BUN 10/28/2013 18  6 - 23 mg/dL Final  . Creatinine, Ser 10/28/2013 0.97  0.50 - 1.35 mg/dL Final  . Calcium 10/28/2013 9.2  8.4 - 10.5 mg/dL Final  . GFR calc non Af Amer 10/28/2013 >90  >90 mL/min Final  . GFR calc Af Amer 10/28/2013 >90  >90 mL/min Final   Comment: (NOTE)                          The eGFR has been calculated using the CKD EPI equation.                          This calculation has not been validated in all clinical situations.                          eGFR's persistently <90 mL/min signify possible Chronic Kidney                           Disease.  . Anion gap 10/28/2013 13  5 - 15 Final  . Glucose-Capillary 10/27/2013 250* 70 - 99 mg/dL Final  . Comment 1 10/27/2013 Notify RN   Final  . Comment 2 10/27/2013 Documented in Chart   Final  . Glucose-Capillary 10/27/2013 211* 70 - 99 mg/dL Final  . Glucose-Capillary 10/28/2013 208* 70 - 99 mg/dL Final  . Glucose-Capillary 10/28/2013 224* 70 - 99 mg/dL Final  Hospital Outpatient Visit on 10/20/2013  Component Date Value Ref Range Status  . MRSA, PCR 10/20/2013 NEGATIVE  NEGATIVE Final  . Staphylococcus aureus 10/20/2013 NEGATIVE  NEGATIVE Final   Comment:                                 The Xpert SA Assay (FDA                          approved for NASAL specimens                          in patients over 15 years of age),                          is one component of                          a comprehensive surveillance  program.  Test performance has                          been validated by [facility] for patients greater                          than or equal to 51 year old.                          It is not intended                          to diagnose infection nor to                          guide or monitor treatment.  Marland Kitchen aPTT 10/20/2013 28  24 - 37 seconds Final  . WBC 10/20/2013 9.5  4.0 - 10.5 K/uL Final  . RBC 10/20/2013 5.36  4.22 - 5.81 MIL/uL Final  . Hemoglobin 10/20/2013 16.8  13.0 - 17.0 g/dL Final  . HCT 10/20/2013 49.7  39.0 - 52.0 % Final  . MCV 10/20/2013 92.7  78.0 - 100.0 fL Final  . MCH 10/20/2013 31.3  26.0 - 34.0 pg Final  . MCHC 10/20/2013 33.8  30.0 - 36.0 g/dL Final  . RDW 10/20/2013 12.8  11.5 - 15.5 % Final  . Platelets 10/20/2013 233  150 - 400 K/uL Final  . Sodium 10/20/2013 137  137 - 147 mEq/L Final  . Potassium 10/20/2013 4.5  3.7 - 5.3 mEq/L Final  . Chloride 10/20/2013 95* 96 - 112 mEq/L Final  . CO2 10/20/2013 28  19 - 32 mEq/L Final  . Glucose, Bld 10/20/2013  324* 70 - 99 mg/dL Final  . BUN 10/20/2013 16  6 - 23 mg/dL Final  . Creatinine, Ser 10/20/2013 0.97  0.50 - 1.35 mg/dL Final  . Calcium 10/20/2013 9.5  8.4 - 10.5 mg/dL Final  . Total Protein 10/20/2013 7.5  6.0 - 8.3 g/dL Final  . Albumin 10/20/2013 4.1  3.5 - 5.2 g/dL Final  . AST 10/20/2013 26  0 - 37 U/L Final  . ALT 10/20/2013 36  0 - 53 U/L Final  . Alkaline Phosphatase 10/20/2013 80  39 - 117 U/L Final  . Total Bilirubin 10/20/2013 0.2* 0.3 - 1.2 mg/dL Final  . GFR calc non Af Amer 10/20/2013 >90  >90 mL/min Final  . GFR calc Af Amer 10/20/2013 >90  >90 mL/min Final   Comment: (NOTE)                          The eGFR has been calculated using the CKD EPI equation.                          This calculation has not been validated in all clinical situations.                          eGFR's persistently <90 mL/min signify possible Chronic Kidney  Disease.  . Anion gap 10/20/2013 14  5 - 15 Final  . Prothrombin Time 10/20/2013 12.9  11.6 - 15.2 seconds Final  . INR 10/20/2013 0.97  0.00 - 1.49 Final  . ABO/RH(D) 10/20/2013 A POS   Final  . Antibody Screen 10/20/2013 NEG   Final  . Sample Expiration 10/20/2013 10/29/2013   Final  . Color, Urine 10/20/2013 YELLOW  YELLOW Final  . APPearance 10/20/2013 CLEAR  CLEAR Final  . Specific Gravity, Urine 10/20/2013 1.029  1.005 - 1.030 Final  . pH 10/20/2013 6.5  5.0 - 8.0 Final  . Glucose, UA 10/20/2013 >1000* NEGATIVE mg/dL Final  . Hgb urine dipstick 10/20/2013 NEGATIVE  NEGATIVE Final  . Bilirubin Urine 10/20/2013 NEGATIVE  NEGATIVE Final  . Ketones, ur 10/20/2013 NEGATIVE  NEGATIVE mg/dL Final  . Protein, ur 10/20/2013 NEGATIVE  NEGATIVE mg/dL Final  . Urobilinogen, UA 10/20/2013 0.2  0.0 - 1.0 mg/dL Final  . Nitrite 10/20/2013 NEGATIVE  NEGATIVE Final  . Leukocytes, UA 10/20/2013 NEGATIVE  NEGATIVE Final  . ABO/RH(D) 10/20/2013 A POS   Final  . RBC / HPF 10/20/2013 0-2  <3 RBC/hpf Final  . Casts 10/20/2013  HYALINE CASTS* NEGATIVE Final  . Urine-Other 10/20/2013 MUCOUS PRESENT   Final     X-Rays:Dg Chest 2 View  10/20/2013   CLINICAL DATA:  Preop for right total knee replacement  EXAM: CHEST  2 VIEW  COMPARISON:  None.  FINDINGS: No active infiltrate or effusion is seen. Mediastinal and hilar contours are unremarkable. The heart is within upper limits of normal. A lower anterior cervical spine fusion plate is present.  IMPRESSION: No active cardiopulmonary disease.   Electronically Signed   By: Ivar Drape M.D.   On: 10/20/2013 09:32    EKG: Orders placed during the hospital encounter of 10/20/13  . EKG 12-LEAD  . EKG 12-LEAD     Hospital Course: Tony Santos is a 53 y.o. who was admitted to [facility]. They were brought to the operating room on 10/26/2013 and underwent Procedure(s): RIGHT TOTAL KNEE ARTHROPLASTY.  Patient tolerated the procedure well and was later transferred to the recovery room and then to the orthopaedic floor for postoperative care.  They were given PO and IV analgesics for pain control following their surgery.  They were given 24 hours of postoperative antibiotics of  Anti-infectives   Start     Dose/Rate Route Frequency Ordered Stop   10/26/13 1600  ceFAZolin (ANCEF) IVPB 2 g/50 mL premix     2 g 100 mL/hr over 30 Minutes Intravenous Every 6 hours 10/26/13 1036 10/26/13 2144   10/26/13 0915  ceFAZolin (ANCEF) 3 g in dextrose 5 % 50 mL IVPB     3 g 160 mL/hr over 30 Minutes Intravenous  Once 10/26/13 0902 10/26/13 0925   10/26/13 0632  ceFAZolin (ANCEF) IVPB 2 g/50 mL premix  Status:  Discontinued     2 g 100 mL/hr over 30 Minutes Intravenous On call to O.R. 10/26/13 6962 10/26/13 0859     and started on DVT prophylaxis in the form of Xarelto.   PT and OT were ordered for total joint protocol.  Discharge planning consulted to help with postop disposition and equipment needs.  Patient had a tough night on the evening of surgery with pain and not much sleep.   They started to get up OOB with therapy on day one. Hemovac drain was pulled without difficulty.  Continued to work with therapy into day two.  Dressing was changed on day two and the incision was healing well.   Patient was seen in rounds and was ready to go home later on POD 2.  Please note, due to the elevated level of glucose, we consulted Diabetic Teaching Services for patient education on proper diet management.  The patient's HGB A1C was noted to be 8.9.  **Spoke with patient about the possibility that he may have DM. Discussed with patient and his wife what an A1c test is and what it measures. Explained that an A1c of 6.5% or greater will be a positive diagnosis of DM and that an A1c of 5.7% to 6.4% is an indicator for "pre-DM" or simply put that patient is at a very high risk for DM. Discussed basic pathophysiology of Type 2 DM, basic home care, basic nutritional concepts, etc. Encouraged patient to eliminate beverages with sugar and high fructose corn syrup from his diet and to drink mostly water and non sugar containing beverages. Also encouraged patient to monitor portion sizes and limit desserts to a few times per week. Patient stated he drinks a lot of regular soda (coke, mountain dew, etc) and will be happy to give them up to help his blood sugars. Reviewed basic carbohydrate counting information with pt and his wife and showed them how to read a food label.  **Encouraged patient to check his MyChart account after discharge to check his A1c results if that test does not result before his d/c home tomorrow. Asked patient to please follow up with his PCP Dr. Luan Pulling about his A1c results. Patient and wife very agreeable to all the information I provided and appreciative of my visit. Gave patient and his wife educational handouts on Type 2 DM and carbohydrate counting guidelines. RD to see pt today to provide more in-depth dietary education to patient. Wyn Quaker RN, MSN, CDE    Diabetes Coordinator  Inpatient Diabetes Program  Team Pager: [phone] (8a-10p)  Nutrition Consult Note  RD consulted for nutrition education regarding diabetic diet.  RD provided "General Healthy Eating" handout from the Academy of Nutrition and Dietetics. Also discussed different food groups and their effects on blood sugar, emphasizing carbohydrate-containing foods.  Pt states his grandfather has diabetes. Pt admits to not watching his carbohydrate intake at home. Started trying to eat healthier over the weekend and cut back on sweets and avoid sodas.  Discussed importance of controlled and consistent carbohydrate intake throughout the day. Provided examples of ways to balance meals/snacks and encouraged intake of high-fiber, whole grain complex carbohydrates. Teach back method used.  Expect good compliance. Wife provided support and encouragement for diet changes.  Body mass index is 38.6 kg/(m^2). Pt meets criteria for class II obesity based on current BMI.  Current diet order is CHO modified, patient is consuming approximately 100% of meals at this time. Labs and medications reviewed. No further nutrition interventions warranted at this time. RD contact information provided. If additional nutrition issues arise, please re-consult RD.  Carlis Stable MS, RD, LDN  Diet: Modified Carb Diet  Activity:WBAT Follow-up:in 2 weeks with Dr. Wynelle Link.  The patient was also instructed to contact Dr. Luan Pulling office to set up proper follow up for work up on his elevated glucose levels. Disposition - Home Discharged Condition: improved       Discharge Instructions   Call MD / Call 911    Complete by:  As directed   If you experience chest pain or shortness of breath, CALL 911 and be transported  to the hospital emergency room.  If you develope a fever above 101 F, pus (white drainage) or increased drainage or redness at the wound, or calf pain, call your surgeon's office.     Change dressing     Complete by:  As directed   Change dressing daily with sterile 4 x 4 inch gauze dressing and apply TED hose. Do not submerge the incision under water.     Constipation Prevention    Complete by:  As directed   Drink plenty of fluids.  Prune juice may be helpful.  You may use a stool softener, such as Colace (over the counter) 100 mg twice a day.  Use MiraLax (over the counter) for constipation as needed.     Diet - low sodium heart healthy    Complete by:  As directed      Diet Carb Modified    Complete by:  As directed      Discharge instructions    Complete by:  As directed   Pick up stool softner and laxative for home. Do not submerge incision under water. May shower. Continue to use ice for pain and swelling from surgery.  Take Xarelto for two and a half more weeks, then discontinue Xarelto. Once the patient has completed the blood thinner regimen, then take a Baby 81 mg Aspirin daily for three more weeks.     Do not put a pillow under the knee. Place it under the heel.    Complete by:  As directed      Do not sit on low chairs, stoools or toilet seats, as it may be difficult to get up from low surfaces    Complete by:  As directed      Driving restrictions    Complete by:  As directed   No driving until released by the physician.     Increase activity slowly as tolerated    Complete by:  As directed      Lifting restrictions    Complete by:  As directed   No lifting until released by the physician.     Patient may shower    Complete by:  As directed   You may shower without a dressing once there is no drainage.  Do not wash over the wound.  If drainage remains, do not shower until drainage stops.     TED hose    Complete by:  As directed   Use stockings (TED hose) for 3 weeks on both leg(s).  You may remove them at night for sleeping.     Weight bearing as tolerated    Complete by:  As directed             Medication List    STOP taking these medications        HYDROcodone-acetaminophen 5-325 MG per tablet  Commonly known as:  NORCO/VICODIN     ibuprofen 200 MG tablet  Commonly known as:  ADVIL,MOTRIN     multivitamin with minerals Tabs tablet      TAKE these medications       amLODipine-benazepril 10-20 MG per capsule  Commonly known as:  LOTREL  Take 1 capsule by mouth every morning.     buPROPion 150 MG 12 hr tablet  Commonly known as:  WELLBUTRIN SR  Take 150 mg by mouth 2 (two) times daily.     methocarbamol 500 MG tablet  Commonly known as:  ROBAXIN  Take 1 tablet (500 mg total) by  mouth every 6 (six) hours as needed for muscle spasms.     omeprazole 20 MG tablet  Commonly known as:  PRILOSEC OTC  Take 20 mg by mouth 2 (two) times daily before a meal.     oxyCODONE 5 MG immediate release tablet  Commonly known as:  Oxy IR/ROXICODONE  Take 1-2 tablets (5-10 mg total) by mouth every 3 (three) hours as needed for moderate pain, severe pain or breakthrough pain.     rivaroxaban 10 MG Tabs tablet  Commonly known as:  XARELTO  - Take 1 tablet (10 mg total) by mouth daily with breakfast. Take Xarelto for two and a half more weeks, then discontinue Xarelto.  - Once the patient has completed the blood thinner regimen, then take a Baby 81 mg Aspirin daily for three more weeks.     rosuvastatin 20 MG tablet  Commonly known as:  CRESTOR  Take 20 mg by mouth daily. Takes in am     traMADol 50 MG tablet  Commonly known as:  ULTRAM  Take 1-2 tablets (50-100 mg total) by mouth every 6 (six) hours as needed (mild pain).       Follow-up Information   Follow up with Gearlean Alf, MD. Schedule an appointment as soon as possible for a visit on 11/10/2013. (Call office today for appointment time.)    Specialty:  Orthopedic Surgery   Contact information:   [address] [place] [address] [phone]       Schedule an appointment as soon as possible for a visit with HAWKINS,EDWARD L, MD. (Please contact Dr.  Luan Pulling' office to set up an appointment in the very near future for work up and management of the elevated glucose levels.)    Specialty:  Pulmonary Disease   Contact information:   [address] [place] [address] [phone]       Signed: Arlee Muslim, PA-C Orthopaedic Surgery 10/28/2013, 2:52 PM

## 2014-01-15 ENCOUNTER — Other Ambulatory Visit: Payer: Self-pay

## 2016-05-04 DIAGNOSIS — E785 Hyperlipidemia, unspecified: Secondary | ICD-10-CM | POA: Diagnosis not present

## 2016-05-04 DIAGNOSIS — M1712 Unilateral primary osteoarthritis, left knee: Secondary | ICD-10-CM | POA: Diagnosis not present

## 2016-05-04 DIAGNOSIS — I10 Essential (primary) hypertension: Secondary | ICD-10-CM | POA: Diagnosis not present

## 2016-05-30 DIAGNOSIS — I10 Essential (primary) hypertension: Secondary | ICD-10-CM | POA: Diagnosis not present

## 2016-05-30 DIAGNOSIS — J209 Acute bronchitis, unspecified: Secondary | ICD-10-CM | POA: Diagnosis not present

## 2016-05-30 DIAGNOSIS — M545 Low back pain: Secondary | ICD-10-CM | POA: Diagnosis not present

## 2016-10-11 DIAGNOSIS — E119 Type 2 diabetes mellitus without complications: Secondary | ICD-10-CM | POA: Diagnosis not present

## 2016-10-11 DIAGNOSIS — I10 Essential (primary) hypertension: Secondary | ICD-10-CM | POA: Diagnosis not present

## 2016-10-11 DIAGNOSIS — M545 Low back pain: Secondary | ICD-10-CM | POA: Diagnosis not present

## 2016-11-08 DIAGNOSIS — I10 Essential (primary) hypertension: Secondary | ICD-10-CM | POA: Diagnosis not present

## 2016-11-08 DIAGNOSIS — M545 Low back pain: Secondary | ICD-10-CM | POA: Diagnosis not present

## 2017-05-13 DIAGNOSIS — M545 Low back pain: Secondary | ICD-10-CM | POA: Diagnosis not present

## 2017-05-13 DIAGNOSIS — I10 Essential (primary) hypertension: Secondary | ICD-10-CM | POA: Diagnosis not present

## 2017-06-05 DIAGNOSIS — E119 Type 2 diabetes mellitus without complications: Secondary | ICD-10-CM | POA: Diagnosis not present

## 2017-06-05 DIAGNOSIS — I10 Essential (primary) hypertension: Secondary | ICD-10-CM | POA: Diagnosis not present

## 2017-11-15 DIAGNOSIS — E789 Disorder of lipoprotein metabolism, unspecified: Secondary | ICD-10-CM | POA: Diagnosis not present

## 2017-11-15 DIAGNOSIS — Z79891 Long term (current) use of opiate analgesic: Secondary | ICD-10-CM | POA: Diagnosis not present

## 2017-11-15 DIAGNOSIS — M545 Low back pain: Secondary | ICD-10-CM | POA: Diagnosis not present

## 2017-11-15 DIAGNOSIS — I10 Essential (primary) hypertension: Secondary | ICD-10-CM | POA: Diagnosis not present

## 2018-01-06 DIAGNOSIS — E789 Disorder of lipoprotein metabolism, unspecified: Secondary | ICD-10-CM | POA: Diagnosis not present

## 2018-01-06 DIAGNOSIS — E119 Type 2 diabetes mellitus without complications: Secondary | ICD-10-CM | POA: Diagnosis not present

## 2018-01-06 DIAGNOSIS — I10 Essential (primary) hypertension: Secondary | ICD-10-CM | POA: Diagnosis not present

## 2018-05-23 DIAGNOSIS — E1169 Type 2 diabetes mellitus with other specified complication: Secondary | ICD-10-CM | POA: Diagnosis not present

## 2018-05-23 DIAGNOSIS — E785 Hyperlipidemia, unspecified: Secondary | ICD-10-CM | POA: Diagnosis not present

## 2018-05-23 DIAGNOSIS — I10 Essential (primary) hypertension: Secondary | ICD-10-CM | POA: Diagnosis not present

## 2018-05-23 DIAGNOSIS — Z79891 Long term (current) use of opiate analgesic: Secondary | ICD-10-CM | POA: Diagnosis not present

## 2018-05-23 DIAGNOSIS — Z125 Encounter for screening for malignant neoplasm of prostate: Secondary | ICD-10-CM | POA: Diagnosis not present

## 2019-03-03 DIAGNOSIS — K859 Acute pancreatitis without necrosis or infection, unspecified: Secondary | ICD-10-CM

## 2019-03-03 HISTORY — DX: Acute pancreatitis without necrosis or infection, unspecified: K85.90

## 2019-03-12 ENCOUNTER — Emergency Department (HOSPITAL_COMMUNITY): Payer: 59

## 2019-03-12 ENCOUNTER — Emergency Department (HOSPITAL_COMMUNITY)
Admission: EM | Admit: 2019-03-12 | Discharge: 2019-03-12 | Disposition: A | Discharge status: Home / Self Care | Payer: 59 | Attending: Emergency Medicine | Admitting: Emergency Medicine

## 2019-03-12 DIAGNOSIS — R1013 Epigastric pain: Secondary | ICD-10-CM | POA: Diagnosis present

## 2019-03-12 DIAGNOSIS — I10 Essential (primary) hypertension: Secondary | ICD-10-CM | POA: Diagnosis not present

## 2019-03-12 DIAGNOSIS — K8020 Calculus of gallbladder without cholecystitis without obstruction: Secondary | ICD-10-CM

## 2019-03-12 DIAGNOSIS — E119 Type 2 diabetes mellitus without complications: Secondary | ICD-10-CM | POA: Diagnosis not present

## 2019-03-12 DIAGNOSIS — R7401 Elevation of levels of liver transaminase levels: Secondary | ICD-10-CM

## 2019-03-12 DIAGNOSIS — R112 Nausea with vomiting, unspecified: Secondary | ICD-10-CM | POA: Insufficient documentation

## 2019-03-12 DIAGNOSIS — Z87891 Personal history of nicotine dependence: Secondary | ICD-10-CM | POA: Diagnosis not present

## 2019-03-12 DIAGNOSIS — Z79899 Other long term (current) drug therapy: Secondary | ICD-10-CM | POA: Diagnosis not present

## 2019-03-12 LAB — URINALYSIS, ROUTINE W REFLEX MICROSCOPIC
Bilirubin Urine: NEGATIVE
Glucose, UA: NEGATIVE mg/dL
Hgb urine dipstick: NEGATIVE
Ketones, ur: 20 mg/dL — AB
Leukocytes,Ua: NEGATIVE
Nitrite: NEGATIVE
Protein, ur: 100 mg/dL — AB
Specific Gravity, Urine: 1.03 (ref 1.005–1.030)
pH: 5 (ref 5.0–8.0)

## 2019-03-12 LAB — COMPREHENSIVE METABOLIC PANEL
ALT: 72 U/L — ABNORMAL HIGH (ref 0–44)
AST: 31 U/L (ref 15–41)
Albumin: 2.9 g/dL — ABNORMAL LOW (ref 3.5–5.0)
Alkaline Phosphatase: 131 U/L — ABNORMAL HIGH (ref 38–126)
Anion gap: 12 (ref 5–15)
BUN: 16 mg/dL (ref 6–20)
CO2: 29 mmol/L (ref 22–32)
Calcium: 9.5 mg/dL (ref 8.9–10.3)
Chloride: 96 mmol/L — ABNORMAL LOW (ref 98–111)
Creatinine, Ser: 1.44 mg/dL — ABNORMAL HIGH (ref 0.61–1.24)
GFR calc Af Amer: >60 mL/min (ref >60)
GFR calc non Af Amer: 53 mL/min — ABNORMAL LOW (ref >60)
Glucose, Bld: 187 mg/dL — ABNORMAL HIGH (ref 70–99)
Potassium: 4.4 mmol/L (ref 3.5–5.1)
Sodium: 137 mmol/L (ref 135–145)
Total Bilirubin: 0.6 mg/dL (ref 0.3–1.2)
Total Protein: 6.8 g/dL (ref 6.5–8.1)

## 2019-03-12 LAB — CBC
HCT: 43.3 % (ref 39.0–52.0)
Hemoglobin: 14.5 g/dL (ref 13.0–17.0)
MCH: 31.6 pg (ref 26.0–34.0)
MCHC: 33.5 g/dL (ref 30.0–36.0)
MCV: 94.3 fL (ref 80.0–100.0)
Platelets: 368 10*3/uL (ref 150–400)
RBC: 4.59 MIL/uL (ref 4.22–5.81)
RDW: 12.3 % (ref 11.5–15.5)
WBC: 14.5 10*3/uL — ABNORMAL HIGH (ref 4.0–10.5)
nRBC: 0 % (ref 0.0–0.2)

## 2019-03-12 LAB — LIPASE, BLOOD: Lipase: 52 U/L — ABNORMAL HIGH (ref 11–51)

## 2019-03-12 MED ORDER — SODIUM CHLORIDE 0.9 % IV BOLUS: 1000.0000 mL | Freq: Once | INTRAVENOUS | Status: DC

## 2019-03-12 NOTE — ED Triage Notes (Signed)
Pt sent from [facility] center for CT abdomen after new diagnosis of pancreatitis that is believed to be caused by the pt taking trulicity for DM. Pt stopped medication. Tony Santos also wants recheck of abd pain labs. VSS. NAD.

## 2019-03-12 NOTE — Discharge Instructions (Addendum)
The [place] [facility] is attached to your chart, please schedule an appointment in order to obtain further evaluation of your gallstones.  Please follow-up with your primary care physician.  Laboratory results along with your report of your ultrasound were provided to you on today's visit.

## 2019-03-12 NOTE — ED Notes (Signed)
Patient verbalizes understanding of discharge instructions. Opportunity for questioning and answers were provided. Armband removed by staff, pt discharged from ED.  

## 2019-03-12 NOTE — ED Provider Notes (Signed)
[facility] EM[facility]ENCY [facility] Provider Note   CSN: [id number] Arrival date & time: 03/12/19  [id number]     History Chief Complaint  Patient presents with  . Abnormal Lab    Tony Santos is a 58 y.o. male.  58 y.o male with a PMH of DM, HTN, presents to the ED [facility].  Patient was seen by his PCP today, according to his paper which was provided, patient was seen this morning for a checkup after being diagnosed with acute pancreatitis on a CT on December 6.  Patient reports about a week ago he began to feel nausea, epigastric abdominal pain, has been unable to eat any solid food for the past week.  According to his paperwork he did have a slight elevation in his creatinine, ALT, lipase was within normal limits.  He was also placed on Cipro, according to patient this was due to a bacterial infection, unknown source.  Patient reports he feels his symptoms have somewhat improved however he was taking Trulicity for his blood glucose control, they suspect that this might have been the cause of his symptoms.  He does endorse some mild epigastric abdominal pain without any radiation.  Medical therapy has been attempted for the symptoms.  Denies any fever, vomiting, pain or shortness of breath.  Of note, he reports occasional drinking, last drink was about 2 months ago, no illicit drug use.  The history is provided by the patient and medical records.  Abnormal Lab      Past Medical History:  Diagnosis Date  . Arthritis    OA  / PAIN RIGHT KNEE  . Bronchitis    IN THE PAST  . GERD (gastroesophageal reflux disease)   . History of kidney stones   . Hypercholesterolemia   . Hypertension   . Pain    LEFT SHOULDER PAIN - RANGE OFMOTION OK  . Pneumonia    IN THE PAST  . Shingles    ABOUT 3 YRS AGO-NO RESIDUAL PROBLEMS FROM THE SHINGLES    Patient Active Problem List   Diagnosis Date Noted  . Hyperglycemia 10/28/2013  . Postop  Hyponatremia 10/28/2013  . OA (osteoarthritis) of knee 10/26/2013    Past Surgical History:  Procedure Laterality Date  . CERVICAL DISK FUSION  ? 2003  . RIGHT KNEE ARTHROSCOPY  2012  . TOTAL KNEE ARTHROPLASTY Right 10/26/2013   Procedure: RIGHT TOTAL KNEE ARTHROPLASTY;  Surgeon: Loanne DrillingFrank Aluisio V, MD;  Location: [facility];  Service: Orthopedics;  Laterality: Right;       No family history on file.  Social History   Tobacco Use  . Smoking status: Former Smoker    Packs/day: 0.50    Years: 10.00    Pack years: 5.00    Types: Cigarettes  . Smokeless tobacco: Never Used  Substance Use Topics  . Alcohol use: No    Comment: QUIT SMOKING 2002  . Drug use: No    Home Medications Prior to Admission medications   Medication Sig Start Date End Date Taking? Authorizing Provider  amLODipine-benazepril (LOTREL) 10-20 MG per capsule Take 1 capsule by mouth every morning.    [provider]  buPROPion (WELLBUTRIN SR) 150 MG 12 hr tablet Take 150 mg by mouth 2 (two) times daily.    [provider]  methocarbamol (ROBAXIN) 500 MG tablet Take 1 tablet (500 mg total) by mouth every 6 (six) hours as needed for muscle spasms. 10/28/13  Perkins, Alexzandrew L, PA-C  omeprazole (PRILOSEC OTC) 20 MG tablet Take 20 mg by mouth 2 (two) times daily before a meal.     [provider]  oxyCODONE (OXY IR/ROXICODONE) 5 MG immediate release tablet Take 1-2 tablets (5-10 mg total) by mouth every 3 (three) hours as needed for moderate pain, severe pain or breakthrough pain. 10/28/13   Perkins, Alexzandrew L, PA-C  rivaroxaban (XARELTO) 10 MG TABS tablet Take 1 tablet (10 mg total) by mouth daily with breakfast. Take Xarelto for two and a half more weeks, then discontinue Xarelto. Once the patient has completed the blood thinner regimen, then take a Baby 81 mg Aspirin daily for three more weeks. 10/28/13   Perkins, Alexzandrew L, PA-C  rosuvastatin (CRESTOR) 20 MG tablet Take 20 mg by mouth  daily. Takes in am    [provider]  traMADol (ULTRAM) 50 MG tablet Take 1-2 tablets (50-100 mg total) by mouth every 6 (six) hours as needed (mild pain). 10/28/13   Perkins, Alexzandrew L, PA-C    Allergies    Other and Penicillins  Review of Systems   Review of Systems  Constitutional: Negative for chills and fever.  HENT: Negative for ear pain and sore throat.   Eyes: Negative for pain and visual disturbance.  Respiratory: Negative for cough and shortness of breath.   Cardiovascular: Negative for chest pain and palpitations.  Gastrointestinal: Positive for abdominal pain, nausea and vomiting. Negative for diarrhea.  Genitourinary: Negative for dysuria and hematuria.  Musculoskeletal: Negative for arthralgias and back pain.  Skin: Negative for color change and rash.  Neurological: Negative for seizures and syncope.  All other systems reviewed and are negative.   Physical Exam Updated Vital Signs BP 125/72   Pulse 80   Temp 98 F (36.7 C) (Oral)   Resp 16   SpO2 94%   Physical Exam Vitals and nursing note reviewed.  Constitutional:      Appearance: He is well-developed.  HENT:     Head: Normocephalic and atraumatic.  Eyes:     General: No scleral icterus.    Pupils: Pupils are equal, round, and reactive to light.  Cardiovascular:     Heart sounds: Normal heart sounds.  Pulmonary:     Effort: Pulmonary effort is normal.     Breath sounds: Normal breath sounds. No wheezing.     Comments: Lungs are clear to auscultation without rales, wheezing, rhonchi. Chest:     Chest wall: No tenderness.  Abdominal:     General: Bowel sounds are normal. There is no distension.     Palpations: Abdomen is soft.     Tenderness: There is no abdominal tenderness.     Comments: Abdomen is soft, nontender to palpation.  Bowel sounds are normal.  Musculoskeletal:        General: No tenderness or deformity.     Cervical back: Normal range of motion.  Skin:    General: Skin  is warm and dry.  Neurological:     Mental Status: He is alert and oriented to person, place, and time.     ED Results / Procedures / Treatments   Labs (all labs ordered are listed, but only abnormal results are displayed) Labs Reviewed  LIPASE, BLOOD - Abnormal; Notable for the following components:      Result Value   Lipase 52 (*)    All other components within normal limits  COMPREHENSIVE METABOLIC PANEL - Abnormal; Notable for the following components:   Chloride  96 (*)    Glucose, Bld 187 (*)    Creatinine, Ser 1.44 (*)    Albumin 2.9 (*)    ALT 72 (*)    Alkaline Phosphatase 131 (*)    GFR calc non Af Amer 53 (*)    All other components within normal limits  CBC - Abnormal; Notable for the following components:   WBC 14.5 (*)    All other components within normal limits  URINALYSIS, ROUTINE W REFLEX MICROSCOPIC - Abnormal; Notable for the following components:   Color, Urine AMBER (*)    APPearance CLOUDY (*)    Ketones, ur 20 (*)    Protein, ur 100 (*)    Bacteria, UA FEW (*)    Non Squamous Epithelial 0-5 (*)    All other components within normal limits    EKG None  Radiology US Abdomen Complete  Result Date: 03/12/2019 CLINICAL DATA:  Upper abdominal pain EXAM: ABDOMEN ULTRASOUND COMPLETE COMPARISON:  None. FINDINGS: Gallbladder: No wall thickening. 7 mm gallstone is identified. No sonographic Murphy sign noted by sonographer. Common bile duct: Diameter: 3 mm, not well visualized Liver: No focal lesion identified. Increased parenchymal echogenicity. Portal vein is patent on color Doppler imaging with normal direction of blood flow towards the liver. IVC: Not well seen below the liver. Pancreas: Obscured. Spleen: Size and appearance within normal limits. Right Kidney: Length: 11.1 cm. Echogenicity within normal limits. No mass or hydronephrosis visualized. Left Kidney: Length: 13.4 cm. Echogenicity within normal limits. No mass or hydronephrosis visualized.  Abdominal aorta: No aneurysm visualized proximally. Mid to distal portions are obscured. Other findings: None. IMPRESSION: Cholelithiasis without sonographic evidence of acute cholecystitis. Increased hepatic echogenicity likely reflecting steatosis. Electronically Signed   By: Guadlupe Spanish M.D.   On: 03/12/2019 12:55    Procedures Procedures (including critical care time)  Medications Ordered in ED Medications  sodium chloride 0.9 % bolus 1,000 mL (1,000 mLs Intravenous Not Given 03/12/19 1346)    ED Course  I have reviewed the triage vital signs and the nursing notes.  Pertinent labs & imaging results that were available during my care of the patient were reviewed by me and considered in my medical decision making (see chart for details).    MDM Rules/Calculators/A&P  Patient with a past medical history of diabetes presents to the ED with complaints of upper abdominal pain.  Seen by his PCP today, was diagnosed with acute pancreatitis about a week ago, had a CT which was remarkable for cholelithiasis, sent patient in for reevaluation along with recollection of labs.  I have extensively review his paperwork from [facility] which is provided to me on a hardcopy by wife.  Lipase level was normal at 57 yesterday, amylase was 91, AST and ALT were 34-85.  Creatinine level was 1.5, according to PCPs note he was sent here for other evaluation of his renal dysfunction, obstructive transaminase.  He was sent here for further evaluation.  Ultrasound of his upper abdomen was obtained which showed: Cholelithiasis without sonographic evidence of acute cholecystitis.    Increased hepatic echogenicity likely reflecting steatosis.     These results were discussed at length with patient, have advised him that I will obtain a surgical consultation in order to have placed and general surgery radar.  1:51 PM Spoke to general surgery who recommended outpatient follow-up.  Patient is to call  in order to schedule an appointment.  Patient is otherwise nontoxic appearing, afebrile, normotensive, without any tachycardia or complaints  of abdominal pain.  He will instructed to be follow-up with [facility] surgery along with his PCP.  Patient understands and agrees with management, return precautions provided at length.   Portions of this note were generated with Lobbyist. Dictation errors may occur despite best attempts at proofreading.   Final Clinical Impression(s) / ED Diagnoses Final diagnoses:  Transaminasemia  Symptomatic cholelithiasis    Rx / DC Orders ED Discharge Orders    None       Janeece Fitting, PA-C 03/12/19 1355    Lucrezia Starch, MD 03/13/19 1649

## 2019-03-13 ENCOUNTER — Ambulatory Visit: Payer: Self-pay | Admitting: General Surgery

## 2019-03-13 NOTE — H&P (Signed)
Tony Santos Documented: 03/13/2019 1:21 PM Location: [facility] Patient #: [id number] DOB: 09/15/1960 Married / Language: [place] / Race: White Male  History of Present Illness Tony Areola Tony. Katalaya Beel Santos; 03/13/2019 2:01 PM) The patient is a 58 year old male who presents for evaluation of gall stones. He is referred by Dr [name] for evaluation of a recent episode of pancreatitis and gallstones found on imaging. He states about a week ago last Wednesday he had to hotdogs for dinner. Afterwards he felt nauseous and queasy. He didn't develop nausea, vomiting and abdominal pain overnight. He points to his upper abdomen. He thought he had food poisoning. He did not have diarrhea. He ended up presenting to his primary care doctor who initially thought he had food poisoning or some type of mild infection. Started on Cipro. He continued to have symptoms and so a CT scan was done in their facility at [facility] which I reviewed the report which showed cholelithiasis without any signs of cholecystitis. No biliary ductal dilatation. There was extensive peripancreatic inflammatory change consistent with pancreatitis. He had an elevated white blood cell count of around 16,000. His creatinine was elevated to around 1-1/2. He had an alkaline phosphatase level of 147, and ALT level of 95. Amylase is 92 and lipase is 36. Because his labs for still abnormal he was sent to the emergency room yesterday. He had an ultrasound which showed gallstones and hepatic steatosis. His white count was down to 14-1/2. His creatinine was trending down to 1.44. His LFTs were essentially normal except for an ALT of 72 and [place] phosphatase audible of 131. His lipase was mildly elevated at 52.  He states that he is feeling fine. He was just advance to a bland diet. He has no abdominal pain currently. No fever or chills. No nausea or vomiting. He does take trulcity for diabetes. He denies any  alcohol use except on rare occasions. His triglyceride level was elevated but not significant. No recent medication changes. There prior abdominal surgery. No chest pain, chest pressure, shortness of breath or dyspnea on exertion.  He is on chronic pain medication through his PCPs office for back and shoulder pain  He is not on oral blood thinner I confirmed this with the patient   Problem List/Past Medical Tony Areola Tony. Andrey Campanile, Santos; 03/13/2019 2:01 PM) CHRONIC NARCOTIC USE (F11.90) OBESITY (BMI 30-39.9) (E66.9) GALLSTONE PANCREATITIS (K85.10)  Past Surgical History Tony Santos; 03/13/2019 1:26 PM) Knee Surgery Right. Spinal Surgery - Neck  Diagnostic Studies History Tony Santos; 03/13/2019 1:26 PM) Colonoscopy never  Allergies Tony Santos; 03/13/2019 1:27 PM) Sulfa Drugs Penicillins  Medication History Tony Areola Tony. Andrey Campanile, Santos; 03/13/2019 1:52 PM) amLODIPine Besy-Benazepril HCl (10-40MG  Capsule, Oral) Active. buPROPion HCl ER (SR) (150MG  Tablet ER 12HR, Oral) Active. Rosuvastatin Calcium (20MG  Tablet, Oral) Active. Robaxin (500MG  Tablet, Oral) Active. Medications Reconciled  Social History , Santos; 03/13/2019 1:26 PM) Alcohol use Remotely quit alcohol use. Caffeine use Carbonated beverages. Tobacco use Former smoker.  Family History , Santos; 03/13/2019 1:26 PM) Breast Cancer Mother. Diabetes Mellitus Mother. Heart disease in male family member before age 54 Hypertension Father.  Other Problems Tony Lovely Tony. 52, Santos; 03/13/2019 2:01 PM) Arthritis Back Pain Cholelithiasis Gastroesophageal Reflux Disease Hypercholesterolemia Pancreatitis High blood pressure Diabetes Mellitus     Review of Systems Tony Santos; 03/13/2019 1:56 PM) General Not Present- Appetite Loss, Chills, Fatigue, Fever, Night Sweats, Weight Gain and Weight Loss. Skin Present- Dryness. Not Present-  Change in Wart/Mole,  Hives, Jaundice, New Lesions, Non-Healing Wounds, Rash and Ulcer. HEENT Present- Wears glasses/contact lenses. Not Present- Earache, Hearing Loss, Hoarseness, Nose Bleed, Oral Ulcers, Ringing in the Ears, Seasonal Allergies, Sinus Pain, Sore Throat, Visual Disturbances and Yellow Eyes. Respiratory Present- Snoring. Not Present- Bloody sputum, Chronic Cough, Difficulty Breathing and Wheezing. Breast Not Present- Breast Mass, Breast Pain, Nipple Discharge and Skin Changes. Cardiovascular Not Present- Chest Pain, Difficulty Breathing Lying Down, Leg Cramps, Palpitations, Rapid Heart Rate, Shortness of Breath and Swelling of Extremities. Gastrointestinal Not Present- Abdominal Pain, Bloating, Bloody Stool, Change in Bowel Habits, Chronic diarrhea, Constipation, Difficulty Swallowing, Excessive gas, Gets full quickly at meals, Hemorrhoids, Indigestion, Nausea, Rectal Pain and Vomiting. Male Genitourinary Not Present- Blood in Urine, Change in Urinary Stream, Frequency, Impotence, Nocturia, Painful Urination, Urgency and Urine Leakage. All other systems negative  Vitals Tony Gasser Santos; 03/13/2019 1:30 PM) 03/13/2019 1:29 PM Weight: 237 lb Height: 70in Body Surface Area: 2.24 Tony Body Mass Index: 34.01 kg/Tony  Temp.: 97.4F(Tympanic)  Pulse: 86 (Regular)  BP: 132/84 (Sitting, Left Arm, Standard)        Physical Exam Tony Hiss Tony. Tony Pyper Santos; 03/13/2019 1:56 PM)  The physical exam findings are as follows: Note:class i obesity, central  General Mental Status-Alert. General Appearance-Consistent with stated age. Hydration-Well hydrated. Voice-Normal.  Head and Neck Head-normocephalic, atraumatic with no lesions or palpable masses. Trachea-midline. Thyroid Gland Characteristics - normal size and consistency.  Eye Eyeball - Bilateral-Extraocular movements intact. Sclera/Conjunctiva - Bilateral-No scleral icterus.  Chest and Lung Exam Chest and lung exam  reveals -quiet, even and easy respiratory effort with no use of accessory muscles and on auscultation, normal breath sounds, no adventitious sounds and normal vocal resonance. Inspection Chest Wall - Normal. Back - normal.  Breast - Did not examine.  Cardiovascular Cardiovascular examination reveals -normal heart sounds, regular rate and rhythm with no murmurs and normal pedal pulses bilaterally.  Abdomen Inspection  Inspection of the abdomen reveals: Note: diastasis, small fascial defect at umbilicus. Skin - Scar - no surgical scars. Palpation/Percussion Palpation and Percussion of the abdomen reveal - Soft, Non Tender, No Rebound tenderness, No Rigidity (guarding) and No hepatosplenomegaly. Auscultation Auscultation of the abdomen reveals - Bowel sounds normal.  Peripheral Vascular Upper Extremity Palpation - Pulses bilaterally normal.  Neurologic Neurologic evaluation reveals -alert and oriented x 3 with no impairment of recent or remote memory. Mental Status-Normal.  Neuropsychiatric The patient's mood and affect are described as -normal. Judgment and Insight-insight is appropriate concerning matters relevant to self.  Musculoskeletal Normal Exam - Left-Upper Extremity Strength Normal and Lower Extremity Strength Normal. Normal Exam - Right-Upper Extremity Strength Normal and Lower Extremity Strength Normal.  Lymphatic Head & Neck  General Head & Neck Lymphatics: Bilateral - Description - Normal. Axillary - Did not examine. Femoral & Inguinal - Did not examine.    Assessment & Plan Tony Hiss Tony. Quadarius Henton Santos; 03/13/2019 1:56 PM)  GALLSTONE PANCREATITIS (K85.10) Impression: I believe the patient's symptoms are consistent with gallbladder disease. given his bump in LFTs and u/s findings of gallstones i think his pancreatitis was due to gallstones. his labs are normalizing and he is feeling well  We discussed gallbladder disease. The patient was given  Neurosurgeon. We discussed non-operative and operative management. We discussed the signs & symptoms of acute cholecystitis  I discussed laparoscopic cholecystectomy with IOC in detail. The patient was given educational material as well as diagrams detailing the procedure. We discussed the risks and benefits of a laparoscopic  cholecystectomy including, but not limited to bleeding, infection, injury to surrounding structures such as the intestine or liver, bile leak, retained gallstones, need to convert to an open procedure, prolonged diarrhea, blood clots such as DVT, common bile duct injury, anesthesia risks, and possible need for additional procedures. We discussed the typical post-operative recovery course. I explained that the likelihood of improvement of their symptoms is good. we discussed possibility of ERCP if ioc positive. we discussed staying on a bland diet.  The patient has elected to proceed with surgery.  Current Plans Pt Education - Pamphlet Given - Laparoscopic Gallbladder Surgery: discussed with patient and provided information. You are being scheduled for surgery- Our schedulers will call you.  You should hear from our office's scheduling department within 5 working days about the location, date, and time of surgery. We try to make accommodations for patient's preferences in scheduling surgery, but sometimes the OR schedule or the surgeon's schedule prevents us from making those accommodations.  If you have not heard from our office [phone]) in 5 working days, call the office and ask for your surgeon's nurse.  If you have other questions about your diagnosis, plan, or surgery, call the office and ask for your surgeon's nurse.   DIABETES MELLITUS TYPE 2 IN OBESE (E11.69)   ESSENTIAL HYPERTENSION (I10)   OBESITY (BMI 30-39.9) (E66.9)   CHRONIC NARCOTIC USE (F11.90)  Mary SellaEric Tony. Andrey CampanileWilson, Santos, FACS General, Bariatric, & Minimally Invasive Surgery [facility] [place]

## 2019-04-22 ENCOUNTER — Encounter (HOSPITAL_BASED_OUTPATIENT_CLINIC_OR_DEPARTMENT_OTHER): Payer: Self-pay | Admitting: General Surgery

## 2019-04-22 ENCOUNTER — Other Ambulatory Visit: Payer: Self-pay

## 2019-04-22 NOTE — Progress Notes (Signed)
Spoke w/ via phone for pre-op interview---[facility] Lab needs dos----  none            Lab results------lab appointment made for 04-24-2019 1000 am for cbc with dif, cmet, ekg and pick up drink COVID test ------04-24-2019 Arrive at -------530 am 04-28-2019 NPO after ------midnight food, clear liquids until 430 am and drink g2 gatorade drink at 430 am then npo Medications to take morning of surgery -----oxycodone ir, bupropion, rosuvastatin, omeprazole, hydrocodone, metorpolol tartrate, clonidine Diabetic medication -----none day of surgeyr Patient Special Instructions -----patient given overnight stay instructions including bring all prescription medications in original containers Pre-Op special Istructions ----- Patient verbalized understanding of instructions that were given at this phone interview. Patient denies shortness of breath, chest pain, fever, cough a this phone interview.

## 2019-04-24 ENCOUNTER — Other Ambulatory Visit (HOSPITAL_COMMUNITY)
Admission: RE | Admit: 2019-04-24 | Discharge: 2019-04-24 | Disposition: A | Discharge status: Home / Self Care | Payer: 59 | Source: Ambulatory Visit | Attending: General Surgery | Admitting: General Surgery

## 2019-04-24 ENCOUNTER — Other Ambulatory Visit: Payer: Self-pay

## 2019-04-24 ENCOUNTER — Encounter (HOSPITAL_COMMUNITY)
Admission: RE | Admit: 2019-04-24 | Discharge: 2019-04-24 | Disposition: A | Discharge status: Home / Self Care | Payer: 59 | Source: Ambulatory Visit | Attending: General Surgery | Admitting: General Surgery

## 2019-04-24 DIAGNOSIS — K8010 Calculus of gallbladder with chronic cholecystitis without obstruction: Secondary | ICD-10-CM | POA: Diagnosis present

## 2019-04-24 DIAGNOSIS — E669 Obesity, unspecified: Secondary | ICD-10-CM | POA: Diagnosis not present

## 2019-04-24 DIAGNOSIS — Z01812 Encounter for preprocedural laboratory examination: Secondary | ICD-10-CM | POA: Diagnosis present

## 2019-04-24 DIAGNOSIS — Z87891 Personal history of nicotine dependence: Secondary | ICD-10-CM | POA: Diagnosis not present

## 2019-04-24 DIAGNOSIS — Z6833 Body mass index (BMI) 33.0-33.9, adult: Secondary | ICD-10-CM | POA: Diagnosis not present

## 2019-04-24 DIAGNOSIS — Z20822 Contact with and (suspected) exposure to covid-19: Secondary | ICD-10-CM | POA: Insufficient documentation

## 2019-04-24 DIAGNOSIS — K828 Other specified diseases of gallbladder: Secondary | ICD-10-CM | POA: Diagnosis not present

## 2019-04-24 DIAGNOSIS — Z880 Allergy status to penicillin: Secondary | ICD-10-CM | POA: Diagnosis not present

## 2019-04-24 DIAGNOSIS — Z981 Arthrodesis status: Secondary | ICD-10-CM | POA: Diagnosis not present

## 2019-04-24 DIAGNOSIS — Z888 Allergy status to other drugs, medicaments and biological substances status: Secondary | ICD-10-CM | POA: Diagnosis not present

## 2019-04-24 DIAGNOSIS — K219 Gastro-esophageal reflux disease without esophagitis: Secondary | ICD-10-CM | POA: Diagnosis not present

## 2019-04-24 DIAGNOSIS — Z96651 Presence of right artificial knee joint: Secondary | ICD-10-CM | POA: Diagnosis not present

## 2019-04-24 DIAGNOSIS — I10 Essential (primary) hypertension: Secondary | ICD-10-CM | POA: Diagnosis not present

## 2019-04-24 DIAGNOSIS — Z87442 Personal history of urinary calculi: Secondary | ICD-10-CM | POA: Diagnosis not present

## 2019-04-24 DIAGNOSIS — E118 Type 2 diabetes mellitus with unspecified complications: Secondary | ICD-10-CM | POA: Diagnosis not present

## 2019-04-24 LAB — COMPREHENSIVE METABOLIC PANEL
ALT: 21 U/L (ref 0–44)
AST: 20 U/L (ref 15–41)
Albumin: 4.4 g/dL (ref 3.5–5.0)
Alkaline Phosphatase: 58 U/L (ref 38–126)
Anion gap: 9 (ref 5–15)
BUN: 28 mg/dL — ABNORMAL HIGH (ref 6–20)
CO2: 26 mmol/L (ref 22–32)
Calcium: 9.7 mg/dL (ref 8.9–10.3)
Chloride: 104 mmol/L (ref 98–111)
Creatinine, Ser: 1.51 mg/dL — ABNORMAL HIGH (ref 0.61–1.24)
GFR calc Af Amer: 57 mL/min — ABNORMAL LOW (ref >60)
GFR calc non Af Amer: 49 mL/min — ABNORMAL LOW (ref >60)
Glucose, Bld: 151 mg/dL — ABNORMAL HIGH (ref 70–99)
Potassium: 5.2 mmol/L — ABNORMAL HIGH (ref 3.5–5.1)
Sodium: 139 mmol/L (ref 135–145)
Total Bilirubin: 0.7 mg/dL (ref 0.3–1.2)
Total Protein: 7.7 g/dL (ref 6.5–8.1)

## 2019-04-24 LAB — CBC WITH DIFFERENTIAL/PLATELET
Abs Immature Granulocytes: 0.03 10*3/uL (ref 0.00–0.07)
Basophils Absolute: 0.1 10*3/uL (ref 0.0–0.1)
Basophils Relative: 1 %
Eosinophils Absolute: 0.5 10*3/uL (ref 0.0–0.5)
Eosinophils Relative: 6 %
HCT: 46.9 % (ref 39.0–52.0)
Hemoglobin: 14.8 g/dL (ref 13.0–17.0)
Immature Granulocytes: 0 %
Lymphocytes Relative: 17 %
Lymphs Abs: 1.5 10*3/uL (ref 0.7–4.0)
MCH: 30.3 pg (ref 26.0–34.0)
MCHC: 31.6 g/dL (ref 30.0–36.0)
MCV: 96.1 fL (ref 80.0–100.0)
Monocytes Absolute: 0.9 10*3/uL (ref 0.1–1.0)
Monocytes Relative: 10 %
Neutro Abs: 5.7 10*3/uL (ref 1.7–7.7)
Neutrophils Relative %: 66 %
Platelets: 307 10*3/uL (ref 150–400)
RBC: 4.88 MIL/uL (ref 4.22–5.81)
RDW: 13.4 % (ref 11.5–15.5)
WBC: 8.7 10*3/uL (ref 4.0–10.5)
nRBC: 0 % (ref 0.0–0.2)

## 2019-04-24 LAB — SARS CORONAVIRUS 2 (TAT 6-24 HRS): SARS Coronavirus 2: NEGATIVE

## 2019-04-27 ENCOUNTER — Other Ambulatory Visit (HOSPITAL_COMMUNITY): Payer: 59

## 2019-04-27 MED ORDER — GENTAMICIN SULFATE 40 MG/ML IJ SOLN
440.0000 mg | INTRAVENOUS | Status: AC
  Administered 2019-04-28: 440 mg via INTRAVENOUS
  Filled 2019-04-27 (×2): qty 11

## 2019-04-27 MED ORDER — CLINDAMYCIN PHOSPHATE 900 MG/50ML IV SOLN
900.0000 mg | INTRAVENOUS | Status: AC
  Filled 2019-04-27 (×2): qty 50

## 2019-04-27 NOTE — Anesthesia Preprocedure Evaluation (Addendum)
Anesthesia Evaluation  Patient identified by MRN, date of birth, ID band Patient awake    Reviewed: Allergy & Precautions, NPO status , Patient's Chart, lab work & pertinent test results  Airway Mallampati: II  TM Distance: >3 FB Neck ROM: Full    Dental no notable dental hx. (+) Teeth Intact, Dental Advisory Given   Pulmonary former smoker,    Pulmonary exam normal breath sounds clear to auscultation       Cardiovascular hypertension, Pt. on medications Normal cardiovascular exam Rhythm:Regular Rate:Normal  NSR    Neuro/Psych negative neurological ROS  negative psych ROS   GI/Hepatic Neg liver ROS, GERD  ,  Endo/Other  diabetes, Type 2  Renal/GU K+ 5.2 Cr 1.51     Musculoskeletal   Abdominal (+) + obese,   Peds  Hematology Hgb 14.8 plt 307   Anesthesia Other Findings   Reproductive/Obstetrics                            Anesthesia Physical Anesthesia Plan  ASA: III  Anesthesia Plan: General   Post-op Pain Management:    Induction: Intravenous  PONV Risk Score and Plan: 3 and Treatment may vary due to age or medical condition, Ondansetron and Dexamethasone  Airway Management Planned: Oral ETT  Additional Equipment: None  Intra-op Plan:   Post-operative Plan: Extubation in OR  Informed Consent: I have reviewed the patients History and Physical, chart, labs and discussed the procedure including the risks, benefits and alternatives for the proposed anesthesia with the patient or authorized representative who has indicated his/her understanding and acceptance.     Dental advisory given  Plan Discussed with:   Anesthesia Plan Comments:        Anesthesia Quick Evaluation

## 2019-04-28 ENCOUNTER — Encounter (HOSPITAL_BASED_OUTPATIENT_CLINIC_OR_DEPARTMENT_OTHER): Payer: Self-pay | Admitting: General Surgery

## 2019-04-28 ENCOUNTER — Ambulatory Visit (HOSPITAL_BASED_OUTPATIENT_CLINIC_OR_DEPARTMENT_OTHER): Payer: 59 | Admitting: Certified Registered Nurse Anesthetist

## 2019-04-28 ENCOUNTER — Ambulatory Visit (HOSPITAL_COMMUNITY): Payer: 59

## 2019-04-28 ENCOUNTER — Other Ambulatory Visit: Payer: Self-pay

## 2019-04-28 ENCOUNTER — Ambulatory Visit (HOSPITAL_BASED_OUTPATIENT_CLINIC_OR_DEPARTMENT_OTHER)
Admission: RE | Admit: 2019-04-28 | Discharge: 2019-04-28 | Disposition: A | Discharge status: Home / Self Care | Payer: 59 | Attending: General Surgery | Admitting: General Surgery

## 2019-04-28 ENCOUNTER — Encounter (HOSPITAL_BASED_OUTPATIENT_CLINIC_OR_DEPARTMENT_OTHER)
Admission: RE | Disposition: A | Discharge status: Home / Self Care | Payer: Self-pay | Source: Home / Self Care | Attending: General Surgery

## 2019-04-28 DIAGNOSIS — K8010 Calculus of gallbladder with chronic cholecystitis without obstruction: Secondary | ICD-10-CM | POA: Insufficient documentation

## 2019-04-28 DIAGNOSIS — Z888 Allergy status to other drugs, medicaments and biological substances status: Secondary | ICD-10-CM | POA: Insufficient documentation

## 2019-04-28 DIAGNOSIS — Z880 Allergy status to penicillin: Secondary | ICD-10-CM | POA: Insufficient documentation

## 2019-04-28 DIAGNOSIS — Z981 Arthrodesis status: Secondary | ICD-10-CM | POA: Insufficient documentation

## 2019-04-28 DIAGNOSIS — Z6833 Body mass index (BMI) 33.0-33.9, adult: Secondary | ICD-10-CM | POA: Insufficient documentation

## 2019-04-28 DIAGNOSIS — I10 Essential (primary) hypertension: Secondary | ICD-10-CM | POA: Insufficient documentation

## 2019-04-28 DIAGNOSIS — Z96651 Presence of right artificial knee joint: Secondary | ICD-10-CM | POA: Insufficient documentation

## 2019-04-28 DIAGNOSIS — Z87891 Personal history of nicotine dependence: Secondary | ICD-10-CM | POA: Insufficient documentation

## 2019-04-28 DIAGNOSIS — Z419 Encounter for procedure for purposes other than remedying health state, unspecified: Secondary | ICD-10-CM

## 2019-04-28 DIAGNOSIS — K219 Gastro-esophageal reflux disease without esophagitis: Secondary | ICD-10-CM | POA: Insufficient documentation

## 2019-04-28 DIAGNOSIS — E118 Type 2 diabetes mellitus with unspecified complications: Secondary | ICD-10-CM | POA: Insufficient documentation

## 2019-04-28 DIAGNOSIS — R739 Hyperglycemia, unspecified: Secondary | ICD-10-CM

## 2019-04-28 DIAGNOSIS — Z87442 Personal history of urinary calculi: Secondary | ICD-10-CM | POA: Insufficient documentation

## 2019-04-28 DIAGNOSIS — E669 Obesity, unspecified: Secondary | ICD-10-CM | POA: Insufficient documentation

## 2019-04-28 DIAGNOSIS — K828 Other specified diseases of gallbladder: Secondary | ICD-10-CM | POA: Insufficient documentation

## 2019-04-28 HISTORY — PX: CHOLECYSTECTOMY: SHX55

## 2019-04-28 HISTORY — DX: Type 2 diabetes mellitus without complications: E11.9

## 2019-04-28 LAB — GLUCOSE, CAPILLARY
Glucose-Capillary: 153 mg/dL — ABNORMAL HIGH (ref 70–99)
Glucose-Capillary: 182 mg/dL — ABNORMAL HIGH (ref 70–99)

## 2019-04-28 SURGERY — LAPAROSCOPIC CHOLECYSTECTOMY WITH INTRAOPERATIVE CHOLANGIOGRAM
Anesthesia: General | Site: Abdomen

## 2019-04-28 MED ORDER — HYDROMORPHONE HCL 1 MG/ML IJ SOLN
0.2500 mg | INTRAMUSCULAR | Status: DC | PRN
  Filled 2019-04-28: qty 0.5

## 2019-04-28 MED ORDER — CHLORHEXIDINE GLUCONATE CLOTH 2 % EX PADS
6.0000 | MEDICATED_PAD | Freq: Once | CUTANEOUS | Status: DC
  Filled 2019-04-28: qty 6

## 2019-04-28 MED ORDER — EPHEDRINE 5 MG/ML INJ
INTRAVENOUS | Status: AC
  Filled 2019-04-28: qty 10

## 2019-04-28 MED ORDER — LIDOCAINE 2% (20 MG/ML) 5 ML SYRINGE
INTRAMUSCULAR | Status: DC | PRN
  Administered 2019-04-28: 100 mg via INTRAVENOUS

## 2019-04-28 MED ORDER — BUPIVACAINE-EPINEPHRINE 0.25% -1:200000 IJ SOLN
INTRAMUSCULAR | Status: DC | PRN
  Administered 2019-04-28: 30 mL

## 2019-04-28 MED ORDER — SODIUM CHLORIDE 0.9 % IV SOLN
INTRAVENOUS | Status: DC | PRN
  Administered 2019-04-28: 09:00:00 13 mL

## 2019-04-28 MED ORDER — SCOPOLAMINE 1 MG/3DAYS TD PT72
1.0000 | MEDICATED_PATCH | TRANSDERMAL | Status: DC
  Administered 2019-04-28: 1.5 mg via TRANSDERMAL
  Filled 2019-04-28: qty 1

## 2019-04-28 MED ORDER — MIDAZOLAM HCL 2 MG/2ML IJ SOLN
INTRAMUSCULAR | Status: DC | PRN
  Administered 2019-04-28: 2 mg via INTRAVENOUS

## 2019-04-28 MED ORDER — LACTATED RINGERS IV SOLN
INTRAVENOUS | Status: DC
  Administered 2019-04-28: 1000 mL via INTRAVENOUS
  Filled 2019-04-28: qty 1000

## 2019-04-28 MED ORDER — FENTANYL CITRATE (PF) 250 MCG/5ML IJ SOLN
INTRAMUSCULAR | Status: DC | PRN
  Administered 2019-04-28 (×5): 50 ug via INTRAVENOUS

## 2019-04-28 MED ORDER — KETOROLAC TROMETHAMINE 30 MG/ML IJ SOLN
30.0000 mg | Freq: Once | INTRAMUSCULAR | Status: DC | PRN
  Filled 2019-04-28: qty 1

## 2019-04-28 MED ORDER — SCOPOLAMINE 1 MG/3DAYS TD PT72
MEDICATED_PATCH | TRANSDERMAL | Status: AC
  Filled 2019-04-28: qty 1

## 2019-04-28 MED ORDER — ROCURONIUM BROMIDE 10 MG/ML (PF) SYRINGE
PREFILLED_SYRINGE | INTRAVENOUS | Status: AC
  Filled 2019-04-28: qty 20

## 2019-04-28 MED ORDER — ACETAMINOPHEN 500 MG PO TABS
1000.0000 mg | ORAL_TABLET | ORAL | Status: AC
  Administered 2019-04-28: 1000 mg via ORAL
  Filled 2019-04-28: qty 2

## 2019-04-28 MED ORDER — PHENYLEPHRINE 40 MCG/ML (10ML) SYRINGE FOR IV PUSH (FOR BLOOD PRESSURE SUPPORT)
PREFILLED_SYRINGE | INTRAVENOUS | Status: AC
  Filled 2019-04-28: qty 30

## 2019-04-28 MED ORDER — CLINDAMYCIN PHOSPHATE 900 MG/50ML IV SOLN
INTRAVENOUS | Status: AC
  Filled 2019-04-28: qty 50

## 2019-04-28 MED ORDER — FENTANYL CITRATE (PF) 100 MCG/2ML IJ SOLN
INTRAMUSCULAR | Status: AC
  Filled 2019-04-28: qty 2

## 2019-04-28 MED ORDER — CEFAZOLIN SODIUM-DEXTROSE 2-4 GM/100ML-% IV SOLN
INTRAVENOUS | Status: AC
  Filled 2019-04-28: qty 100

## 2019-04-28 MED ORDER — PHENYLEPHRINE HCL-NACL 10-0.9 MG/250ML-% IV SOLN
INTRAVENOUS | Status: DC | PRN
  Administered 2019-04-28: 25 ug/min via INTRAVENOUS

## 2019-04-28 MED ORDER — ONDANSETRON HCL 4 MG/2ML IJ SOLN
INTRAMUSCULAR | Status: AC
  Filled 2019-04-28: qty 4

## 2019-04-28 MED ORDER — PHENYLEPHRINE HCL (PRESSORS) 10 MG/ML IV SOLN
INTRAVENOUS | Status: AC
  Filled 2019-04-28: qty 1

## 2019-04-28 MED ORDER — ACETAMINOPHEN 500 MG PO TABS
1000.0000 mg | ORAL_TABLET | Freq: Once | ORAL | Status: DC
  Filled 2019-04-28: qty 2

## 2019-04-28 MED ORDER — GABAPENTIN 300 MG PO CAPS
ORAL_CAPSULE | ORAL | Status: AC
  Filled 2019-04-28: qty 1

## 2019-04-28 MED ORDER — SUCCINYLCHOLINE CHLORIDE 200 MG/10ML IV SOSY
PREFILLED_SYRINGE | INTRAVENOUS | Status: AC
  Filled 2019-04-28: qty 10

## 2019-04-28 MED ORDER — PHENYLEPHRINE 40 MCG/ML (10ML) SYRINGE FOR IV PUSH (FOR BLOOD PRESSURE SUPPORT)
PREFILLED_SYRINGE | INTRAVENOUS | Status: DC | PRN
  Administered 2019-04-28: 120 ug via INTRAVENOUS
  Administered 2019-04-28 (×2): 80 ug via INTRAVENOUS
  Administered 2019-04-28: 120 ug via INTRAVENOUS

## 2019-04-28 MED ORDER — ROCURONIUM BROMIDE 10 MG/ML (PF) SYRINGE
PREFILLED_SYRINGE | INTRAVENOUS | Status: DC | PRN
  Administered 2019-04-28: 60 mg via INTRAVENOUS

## 2019-04-28 MED ORDER — PROPOFOL 10 MG/ML IV BOLUS
INTRAVENOUS | Status: AC
  Filled 2019-04-28: qty 20

## 2019-04-28 MED ORDER — DEXAMETHASONE SODIUM PHOSPHATE 10 MG/ML IJ SOLN
INTRAMUSCULAR | Status: DC | PRN
  Administered 2019-04-28: 10 mg via INTRAVENOUS

## 2019-04-28 MED ORDER — SUGAMMADEX SODIUM 500 MG/5ML IV SOLN
INTRAVENOUS | Status: AC
  Filled 2019-04-28: qty 5

## 2019-04-28 MED ORDER — GABAPENTIN 300 MG PO CAPS
300.0000 mg | ORAL_CAPSULE | ORAL | Status: AC
  Administered 2019-04-28: 06:00:00 300 mg via ORAL
  Filled 2019-04-28: qty 1

## 2019-04-28 MED ORDER — PROPOFOL 10 MG/ML IV BOLUS
INTRAVENOUS | Status: DC | PRN
  Administered 2019-04-28: 50 mg via INTRAVENOUS
  Administered 2019-04-28: 150 mg via INTRAVENOUS

## 2019-04-28 MED ORDER — ACETAMINOPHEN 500 MG PO TABS
ORAL_TABLET | ORAL | Status: AC
  Filled 2019-04-28: qty 2

## 2019-04-28 MED ORDER — LIDOCAINE 2% (20 MG/ML) 5 ML SYRINGE
INTRAMUSCULAR | Status: AC
  Filled 2019-04-28: qty 10

## 2019-04-28 MED ORDER — ONDANSETRON HCL 4 MG/2ML IJ SOLN
4.0000 mg | Freq: Once | INTRAMUSCULAR | Status: DC | PRN
  Filled 2019-04-28: qty 2

## 2019-04-28 MED ORDER — DEXAMETHASONE SODIUM PHOSPHATE 10 MG/ML IJ SOLN
INTRAMUSCULAR | Status: AC
  Filled 2019-04-28: qty 2

## 2019-04-28 MED ORDER — OXYCODONE HCL 5 MG/5ML PO SOLN
5.0000 mg | Freq: Once | ORAL | Status: DC | PRN
  Filled 2019-04-28: qty 5

## 2019-04-28 MED ORDER — FENTANYL CITRATE (PF) 250 MCG/5ML IJ SOLN
INTRAMUSCULAR | Status: AC
  Filled 2019-04-28: qty 5

## 2019-04-28 MED ORDER — SUGAMMADEX SODIUM 500 MG/5ML IV SOLN
INTRAVENOUS | Status: DC | PRN
  Administered 2019-04-28: 300 mg via INTRAVENOUS

## 2019-04-28 MED ORDER — MIDAZOLAM HCL 2 MG/2ML IJ SOLN
INTRAMUSCULAR | Status: AC
  Filled 2019-04-28: qty 2

## 2019-04-28 MED ORDER — CLINDAMYCIN PHOSPHATE 900 MG/50ML IV SOLN
INTRAVENOUS | Status: DC | PRN
  Administered 2019-04-28: 900 mg via INTRAVENOUS

## 2019-04-28 MED ORDER — OXYCODONE HCL 5 MG PO TABS
5.0000 mg | ORAL_TABLET | Freq: Once | ORAL | Status: DC | PRN
  Filled 2019-04-28: qty 1

## 2019-04-28 MED ORDER — ONDANSETRON HCL 4 MG/2ML IJ SOLN
INTRAMUSCULAR | Status: DC | PRN
  Administered 2019-04-28: 4 mg via INTRAVENOUS

## 2019-04-28 MED ORDER — EPHEDRINE SULFATE-NACL 50-0.9 MG/10ML-% IV SOSY
PREFILLED_SYRINGE | INTRAVENOUS | Status: DC | PRN
  Administered 2019-04-28: 5 mg via INTRAVENOUS
  Administered 2019-04-28: 10 mg via INTRAVENOUS

## 2019-04-28 SURGICAL SUPPLY — 58 items
ADH SKN CLS APL DERMABOND .7 (GAUZE/BANDAGES/DRESSINGS)
APL PRP STRL LF DISP 70% ISPRP (MISCELLANEOUS) ×1
APL SKNCLS STERI-STRIP NONHPOA (GAUZE/BANDAGES/DRESSINGS)
APL SWBSTK 6 STRL LF DISP (MISCELLANEOUS) ×1
APPLICATOR COTTON TIP 6 STRL (MISCELLANEOUS) ×1 IMPLANT
APPLICATOR COTTON TIP 6IN STRL (MISCELLANEOUS) ×3
APPLIER CLIP LOGIC TI 5 (MISCELLANEOUS) ×3 IMPLANT
APPLIER CLIP ROT 10 11.4 M/L (STAPLE)
APR CLP MED LRG 11.4X10 (STAPLE)
APR CLP MED LRG 33X5 (MISCELLANEOUS) ×1
BAG RETRIEVAL 10MM (BASKET)
BENZOIN TINCTURE PRP APPL 2/3 (GAUZE/BANDAGES/DRESSINGS) IMPLANT
BLADE CLIPPER SENSICLIP SURGIC (BLADE) ×2 IMPLANT
BNDG ADH 1X3 SHEER STRL LF (GAUZE/BANDAGES/DRESSINGS) ×11 IMPLANT
BNDG ADH THN 3X1 STRL LF (GAUZE/BANDAGES/DRESSINGS) ×4
CANISTER SUCT 3000ML PPV (MISCELLANEOUS) IMPLANT
CANISTER SUCTION 1200CC (MISCELLANEOUS) IMPLANT
CHLORAPREP W/TINT 26 (MISCELLANEOUS) ×2 IMPLANT
CLIP APPLIE ROT 10 11.4 M/L (STAPLE) IMPLANT
CLOSURE WOUND 1/2 X4 (GAUZE/BANDAGES/DRESSINGS) ×1
CLOTH BEACON ORANGE TIMEOUT ST (SAFETY) ×3 IMPLANT
COVER WAND RF STERILE (DRAPES) ×3 IMPLANT
DERMABOND ADVANCED (GAUZE/BANDAGES/DRESSINGS)
DERMABOND ADVANCED .7 DNX12 (GAUZE/BANDAGES/DRESSINGS) IMPLANT
DISSECTOR BLUNT TIP ENDO 5MM (MISCELLANEOUS) IMPLANT
DRAPE C-ARM 42X72 X-RAY (DRAPES) ×3 IMPLANT
DRAPE LAPAROSCOPIC ABDOMINAL (DRAPES) ×3 IMPLANT
DRAPE SHEET LG 3/4 BI-LAMINATE (DRAPES) ×2 IMPLANT
DRSG TEGADERM 2-3/8X2-3/4 SM (GAUZE/BANDAGES/DRESSINGS) ×3 IMPLANT
ELECT REM PT RETURN 9FT ADLT (ELECTROSURGICAL) ×3
ELECTRODE REM PT RTRN 9FT ADLT (ELECTROSURGICAL) ×1 IMPLANT
GLOVE BIO SURGEON STRL SZ7.5 (GLOVE) ×3 IMPLANT
GRASPER SUT TROCAR 14GX15 (MISCELLANEOUS) ×2 IMPLANT
HEMOSTAT SNOW SURGICEL 2X4 (HEMOSTASIS) ×2 IMPLANT
KIT TURNOVER CYSTO (KITS) ×3 IMPLANT
MANIFOLD NEPTUNE II (INSTRUMENTS) IMPLANT
NS IRRIG 500ML POUR BTL (IV SOLUTION) ×3 IMPLANT
PACK BASIN DAY SURGERY FS (CUSTOM PROCEDURE TRAY) ×3 IMPLANT
SCISSORS LAP 5X35 DISP (ENDOMECHANICALS) ×2 IMPLANT
SET CHOLANGIOGRAPH MIX (MISCELLANEOUS) IMPLANT
SET IRRIG TUBING LAPAROSCOPIC (IRRIGATION / IRRIGATOR) ×3 IMPLANT
SLEEVE SCD COMPRESS KNEE MED (MISCELLANEOUS) ×3 IMPLANT
SOLUTION ANTI FOG 6CC (MISCELLANEOUS) ×3 IMPLANT
STRIP CLOSURE SKIN 1/2X4 (GAUZE/BANDAGES/DRESSINGS) ×1 IMPLANT
SUT MNCRL AB 4-0 PS2 18 (SUTURE) ×3 IMPLANT
SUT VICRYL 0 UR6 27IN ABS (SUTURE) ×2 IMPLANT
SYS BAG RETRIEVAL 10MM (BASKET)
SYSTEM BAG RETRIEVAL 10MM (BASKET) IMPLANT
TOWEL OR 17X26 10 PK STRL BLUE (TOWEL DISPOSABLE) ×4 IMPLANT
TRAY DSU PREP LF (CUSTOM PROCEDURE TRAY) ×1 IMPLANT
TRAY LAPAROSCOPIC (CUSTOM PROCEDURE TRAY) ×4 IMPLANT
TROCAR XCEL BLADELESS 5X75MML (TROCAR) ×5 IMPLANT
TROCAR XCEL BLUNT TIP 100MML (ENDOMECHANICALS) ×3 IMPLANT
TROCAR XCEL NON-BLD 11X100MML (ENDOMECHANICALS) IMPLANT
TUBE CONNECTING 12'X1/4 (SUCTIONS)
TUBE CONNECTING 12X1/4 (SUCTIONS) IMPLANT
TUBING INSUFFLATION 10FT LAP (TUBING) ×3 IMPLANT
WATER STERILE IRR 500ML POUR (IV SOLUTION) IMPLANT

## 2019-04-28 NOTE — Anesthesia Procedure Notes (Signed)
Date/Time: 04/28/2019 9:07 AM Performed by: Minerva Ends, CRNA Oxygen Delivery Method: Simple face mask Placement Confirmation: positive ETCO2 and breath sounds checked- equal and bilateral Dental Injury: Teeth and Oropharynx as per pre-operative assessment

## 2019-04-28 NOTE — Anesthesia Postprocedure Evaluation (Signed)
Anesthesia Post Note  Patient: Tony Santos  Procedure(s) Performed: LAPAROSCOPIC CHOLECYSTECTOMY WITH INTRAOPERATIVE CHOLANGIOGRAM (N/A Abdomen)     Patient location during evaluation: PACU Anesthesia Type: General Level of consciousness: awake and alert Pain management: pain level controlled Vital Signs Assessment: post-procedure vital signs reviewed and stable Respiratory status: spontaneous breathing, nonlabored ventilation, respiratory function stable and patient connected to nasal cannula oxygen Cardiovascular status: blood pressure returned to baseline and stable Postop Assessment: no apparent nausea or vomiting Anesthetic complications: no    Last Vitals:  Vitals:   04/28/19 0945 04/28/19 1015  BP: 114/64 132/71  Pulse: 73 74  Resp: 13 16  Temp:    SpO2: 96% 99%    Last Pain:  Vitals:   04/28/19 1015  TempSrc:   PainSc: 0-No pain                 Trevor Iha

## 2019-04-28 NOTE — Op Note (Signed)
Tony Santos [id number] 04-07-1960 04/28/2019  Laparoscopic Cholecystectomy with IOC Procedure Note  Indications: This patient presents with symptomatic gallbladder disease & history of gallstone pancreatitis. and will undergo laparoscopic cholecystectomy.  Pre-operative Diagnosis: history of gallstone pancreatitis  Post-operative Diagnosis: Calculus of gallbladder with other cholecystitis, without mention of obstruction  Surgeon: Greer Pickerel MD FACS  Assistants: Armandina Gemma MD FACS (an assistant was requested due to the patient's history of fairly significant gallstone pancreatitis with inflammation of the duodenal bulb on CT imaging, retraction and mobilization of tissue)  Anesthesia: General endotracheal anesthesia  Procedure Details  The patient was seen again in the Holding Room. The risks, benefits, complications, treatment options, and expected outcomes were discussed with the patient. The possibilities of reaction to medication, pulmonary aspiration, perforation of viscus, bleeding, recurrent infection, finding a normal gallbladder, the need for additional procedures, failure to diagnose a condition, the possible need to convert to an open procedure, and creating a complication requiring transfusion or operation were discussed with the patient. The likelihood of improving the patient's symptoms with return to their baseline status is good.  The patient and/or family concurred with the proposed plan, giving informed consent. The site of surgery properly noted. The patient was taken to Operating Room, identified as Tony Santos and the procedure verified as Laparoscopic Cholecystectomy with Intraoperative Cholangiogram. A Time Out was held and the above information confirmed. Antibiotic prophylaxis was administered.   Prior to the induction of general anesthesia, antibiotic prophylaxis was administered. General endotracheal anesthesia was then administered and tolerated well. After  the induction, the abdomen was prepped with Chloraprep and draped in the sterile fashion. The patient was positioned in the supine position.  Local anesthetic agent was injected into the skin near the umbilicus and an incision made. We dissected down to the abdominal fascia with blunt dissection.  The fascia was incised vertically and we entered the peritoneal cavity bluntly.  A pursestring suture of 0-Vicryl was placed around the fascial opening.  The Hasson cannula was inserted and secured with the stay suture.  Pneumoperitoneum was then created with CO2 and tolerated well without any adverse changes in the patient's vital signs. An 5-mm port was placed in the subxiphoid position.  Two 5-mm ports were placed in the right upper quadrant. All skin incisions were infiltrated with a local anesthetic agent before making the incision and placing the trocars.   We positioned the patient in reverse Trendelenburg, tilted slightly to the patient's left.  The gallbladder was identified.  There is a fair amount of omentum encasing and adhered to the gallbladder itself.  Using hook hook electrocautery I was able to dissect some of the omentum off of the gallbladder itself which then allowed the fundus to be grasped and retracted cephalad. Adhesions were lysed bluntly and with the electrocautery where indicated, taking care not to injure any adjacent organs or viscus.  The omentum around the infundibulum and confluence of the cystic duct and infundibulum was friable.  I was eventually able to mobilize the omentum off of the infundibulum.  The infundibulum was grasped and retracted laterally, exposing the peritoneum overlying the triangle of Calot. This was then divided and exposed in a blunt fashion. A critical view of the cystic duct and cystic artery was obtained.  Given the dilation of the cystic duct I decided to change the subxiphoid trocar to an 11 mm trocar in order to accommodate a larger clip size.  Clips were  placed on the cystic artery. the  cystic duct was clearly identified and bluntly dissected circumferentially.  The cystic duct had chronic dilation.  The cystic duct was ligated with a clip distally.   An incision was made in the cystic duct and the [facility] cholangiogram catheter introduced. The catheter was secured using a clip. A cholangiogram was then obtained which showed good visualization of the distal and proximal biliary tree with no sign of filling defects or obstruction.  Contrast flowed easily into the duodenum. The catheter was then removed.   The cystic duct was then ligated with clips and divided. The cystic artery which had been identified & dissected free and ligated with clips and divided as well.   The gallbladder was dissected from the liver bed in retrograde fashion with the electrocautery.  There was some spillage of bile from the gallbladder as well as a few stones.  The stones were manually extracted from the abdomen.  The gallbladder was removed and placed in an Ecco sac.  The gallbladder and Ecco sac were then removed through the umbilical port site. The liver bed was irrigated and inspected. Hemostasis was achieved with the electrocautery. Copious irrigation was utilized and was repeatedly aspirated until clear.  I did elect to place a piece of surgical snow in the gallbladder fossa.  The pursestring suture was used to close the umbilical fascia.  Additional interrupted 0 Vicryl suture was placed at the umbilical fascia using the PMI suture passer with laparoscopic guidance.  Local was infiltrated around the umbilical area and along the right lateral abdominal wall using laparoscopic guidance  We again inspected the right upper quadrant for hemostasis.  The umbilical closure was inspected and there was no air leak and nothing trapped within the closure. Pneumoperitoneum was released as we removed the trocars.  4-0 Monocryl was used to close the skin.   Benzoin, steri-strips, and clean  dressings were applied. The patient was then extubated and brought to the recovery room in stable condition. Instrument, sponge, and needle counts were correct at closure and at the conclusion of the case.   Findings: Chronic cholecystitis with Cholelithiasis Positive critical view Normal IOC Positive surgical snow in the gallbladder fossa  Estimated Blood Loss: 25 cc         Drains: None         Specimens: Gallbladder           Complications: None; patient tolerated the procedure well.         Disposition: PACU - hemodynamically stable.         Condition: stable  Mary Sella. Andrey Campanile, MD, FACS General, Bariatric, & Minimally Invasive Surgery [facility], [place]

## 2019-04-28 NOTE — H&P (Signed)
Tony Santos is an 59 y.o. male.   Chief Complaint: here for surgery  HPI: 59 yo male presents for lap chole with ioc due to gallstone pancreatitis.  No changes since last seen in clinic No abd pain, f/v/c/n/cp/sob. Been feeling well  The patient is a 59 year old male who presents for evaluation of gall stones. He is referred by Dr Brock Bad for evaluation of a recent episode of pancreatitis and gallstones found on imaging. He states about a week ago last Wednesday he had to hotdogs for dinner. Afterwards he felt nauseous and queasy. He didn't develop nausea, vomiting and abdominal pain overnight. He points to his upper abdomen. He thought he had food poisoning. He did not have diarrhea. He ended up presenting to his primary care doctor who initially thought he had food poisoning or some type of mild infection. Started on Cipro. He continued to have symptoms and so a CT scan was done in their facility at [facility] which I reviewed the report which showed cholelithiasis without any signs of cholecystitis. No biliary ductal dilatation. There was extensive peripancreatic inflammatory change consistent with pancreatitis. He had an elevated white blood cell count of around 16,000. His creatinine was elevated to around 1-1/2. He had an alkaline phosphatase level of 147, and ALT level of 95. Amylase is 92 and lipase is 36. Because his labs for still abnormal he was sent to the emergency room yesterday. He had an ultrasound which showed gallstones and hepatic steatosis. His white count was down to 14-1/2. His creatinine was trending down to 1.44. His LFTs were essentially normal except for an ALT of 72 and [place] phosphatase audible of 131. His lipase was mildly elevated at 52.  He states that he is feeling fine. He was just advance to a bland diet. He has no abdominal pain currently. No fever or chills. No nausea or vomiting. He does take trulcity for diabetes. He denies  any alcohol use except on rare occasions. His triglyceride level was elevated but not significant. No recent medication changes. There prior abdominal surgery. No chest pain, chest pressure, shortness of breath or dyspnea on exertion.  He is on chronic pain medication through his PCPs office for back and shoulder pain  He is not on oral blood thinner I confirmed this with the patient  Past Medical History:  Diagnosis Date  . Acute pancreatitis 03/2019  . Arthritis    OA  / PAIN RIGHT KNEE  . Bronchitis    IN THE PAST  . Diabetes mellitus without complication ([place])    type 2  . GERD (gastroesophageal reflux disease)   . History of kidney stones   . Hypercholesterolemia   . Hypertension   . Pain    LEFT SHOULDER PAIN - RANGE OFMOTION OK  . Pneumonia    IN THE PAST  . Shingles    ABOUT 3 YRS AGO-NO RESIDUAL PROBLEMS FROM THE SHINGLES    Past Surgical History:  Procedure Laterality Date  . CERVICAL DISK FUSION  ? 2003  . RIGHT KNEE ARTHROSCOPY  2012  . TOTAL KNEE ARTHROPLASTY Right 10/26/2013   Procedure: RIGHT TOTAL KNEE ARTHROPLASTY;  Surgeon: Gearlean Alf, MD;  Location: [facility];  Service: Orthopedics;  Laterality: Right;    History reviewed. No pertinent family history. Social History:  reports that he has quit smoking. His smoking use included cigarettes. He has a 5.00 pack-year smoking history. He has never used smokeless tobacco. He reports that he does  not drink alcohol or use drugs.  Allergies:  Allergies  Allergen Reactions  . Other     Shellfish - shrimp - "sick as a dog"  . Penicillins     Rash, hives  . Trulicity [Dulaglutide]     Acute pancreatitis  . Xarelto [Rivaroxaban]     Blood in stool    Medications Prior to Admission  Medication Sig Dispense Refill  . amLODipine-benazepril (LOTREL) 10-40 MG capsule Take 1 capsule by mouth daily.    Marland Kitchen buPROPion (WELLBUTRIN SR) 150 MG 12 hr tablet Take 150 mg by mouth 2 (two) times daily.    . cloNIDine  (CATAPRES) 0.1 MG tablet Take 0.1 mg by mouth 2 (two) times daily.    Marland Kitchen HYDROcodone-acetaminophen (NORCO) 10-325 MG tablet Take 1 tablet by mouth 2 (two) times daily. 2 in am 1 in pm    . ibuprofen (ADVIL) 800 MG tablet Take 800 mg by mouth 2 (two) times daily.    . Insulin Glargine (BASAGLAR KWIKPEN) 100 UNIT/ML SOPN Inject 10 Units into the skin daily.    . metFORMIN (GLUCOPHAGE) 500 MG tablet Take by mouth 2 (two) times daily with a meal.    . omeprazole (PRILOSEC OTC) 20 MG tablet Take 20 mg by mouth 2 (two) times daily before a meal.     . oxyCODONE (OXY IR/ROXICODONE) 5 MG immediate release tablet Take 1-2 tablets (5-10 mg total) by mouth every 3 (three) hours as needed for moderate pain, severe pain or breakthrough pain. (Patient taking differently: Take 5-10 mg by mouth 2 (two) times daily. ) 80 tablet 0  . rosuvastatin (CRESTOR) 20 MG tablet Take 20 mg by mouth daily. Takes in am      No results found for this or any previous visit (from the past 48 hour(s)). No results found.  Review of Systems  All other systems reviewed and are negative.   Blood pressure (!) 144/82, pulse 66, temperature 99.4 F (37.4 C), temperature source Oral, resp. rate 14, height 5\' 10"  (1.778 m), weight 104.8 kg, SpO2 98 %. Physical Exam  Vitals reviewed. Constitutional: He is oriented to person, place, and time. He appears well-developed and well-nourished. No distress.  obese  HENT:  Head: Normocephalic and atraumatic.  Right Ear: External ear normal.  Left Ear: External ear normal.  Eyes: Conjunctivae are normal. No scleral icterus.  Neck: No tracheal deviation present. No thyromegaly present.  Cardiovascular: Normal rate and normal heart sounds.  Respiratory: Effort normal and breath sounds normal. No stridor. No respiratory distress. He has no wheezes.  GI: Soft. He exhibits no distension. There is no abdominal tenderness. There is no rebound.  Musculoskeletal:        General: No tenderness or  edema.     Cervical back: Normal range of motion and neck supple.  Lymphadenopathy:    He has no cervical adenopathy.  Neurological: He is alert and oriented to person, place, and time. He exhibits normal muscle tone.  Skin: Skin is warm and dry. No rash noted. He is not diaphoretic. No erythema. No pallor.  Psychiatric: He has a normal mood and affect. His behavior is normal. Judgment and thought content normal.     Assessment/Plan H/o gallstone pancreatitis Chronic pain Obesity DM2 HTN  To or for lap chole with ioc All questions asked & answered  . Mary Sella, MD, FACS General, Bariatric, & Minimally Invasive Surgery [facility], [facility]   [place], MD 04/28/2019, 7:23 AM

## 2019-04-28 NOTE — Discharge Instructions (Signed)
[facility] LAPAROSCOPIC [facility]: POST OP INSTRUCTIONS Always review your discharge instruction sheet given to you by the facility where your surgery was performed. IF YOU HAVE DISABILITY OR FAMILY LEAVE FORMS, YOU MUST BRING THEM TO THE OFFICE FOR PROCESSING.   DO NOT GIVE THEM TO YOUR DOCTOR.  PAIN CONTROL  1. First take acetaminophen (Tylenol) AND/or ibuprofen (Advil) to control your pain after surgery.  Follow directions on package.  Taking acetaminophen (Tylenol) and/or ibuprofen (Advil) regularly after surgery will help to control your pain and lower the amount of prescription pain medication you may need.  You should not take more than 3,000 mg (3 grams) of acetaminophen (Tylenol) in 24 hours from ALL SOURCES.  You should not take ibuprofen (Advil), aleve, motrin, naprosyn or other NSAIDS if you have a history of stomach ulcers or chronic kidney disease.  2. Use ice packs to help control pain. If you need a refill on your pain medication, please contact your pain medicine physician & pharmacy.   HOME MEDICATIONS 3. Take your usually prescribed medications unless otherwise directed.  DIET 4. You should follow a light diet the first few days after arrival home.  Be sure to include lots of fluids daily. Avoid fatty, fried foods.   CONSTIPATION 5. It is common to experience some constipation after surgery and if you are taking pain medication.  Increasing fluid intake and taking a stool softener (such as Colace) will usually help or prevent this problem from occurring.  A mild laxative (Milk of Magnesia or Miralax) should be taken according to package instructions if there are no bowel movements after 48 hours.  WOUND/INCISION CARE 6. Most patients will experience some swelling and bruising in the area of the incisions.  Ice packs will help.  Swelling and bruising can take several days to resolve.  7. Unless discharge instructions indicate otherwise, follow guidelines  below  a. STERI-STRIPS - you may remove your outer bandages 48 hours after surgery, and you may shower at that time.  You have steri-strips (small skin tapes) in place directly over the incision.  These strips should be left on the skin for 7-10 days.   b. DERMABOND/SKIN GLUE - you may shower in 24 hours.  The glue will flake off over the next 2-3 weeks. 8. Any sutures or staples will be removed at the office during your follow-up visit.  ACTIVITIES 9. You may resume regular (light) daily activities beginning the next day--such as daily self-care, walking, climbing stairs--gradually increasing activities as tolerated.  You may have sexual intercourse when it is comfortable.  Refrain from any heavy lifting or straining until approved by your doctor. a. You may drive when you are no longer taking prescription pain medication, you can comfortably wear a seatbelt, and you can safely maneuver your car and apply brakes.  FOLLOW-UP 10. You should see your doctor in the office for a follow-up appointment approximately 2-3 weeks after your surgery.  You should have been given your post-op/follow-up appointment when your surgery was scheduled.  If you did not receive a post-op/follow-up appointment, make sure that you call for this appointment within a day or two after you arrive home to insure a convenient appointment time.  OTHER INSTRUCTIONS 11.   WHEN TO CALL YOUR DOCTOR: 1. Fever over 101.0 2. Inability to urinate 3. Continued bleeding from incision. 4. Increased pain, redness, or drainage from the incision. 5. Increasing abdominal pain  The clinic staff is available to answer your questions  during regular business hours.  Please don't hesitate to call and ask to speak to one of the nurses for clinical concerns.  If you have a medical emergency, go to the nearest emergency room or call 911.  A surgeon from [facility] is always on call at the hospital. [address], [place], [place]  [address] ? [address] [address], [place], [place]   [address] [phone] ? [phone] ? FAX [phone] Web site: [email or web address]    Post Anesthesia Home Care Instructions  Activity: Get plenty of rest for the remainder of the day. A responsible individual must stay with you for 24 hours following the procedure.  For the next 24 hours, DO NOT: -Drive a car -Advertising copywriter -Drink alcoholic beverages -Take any medication unless instructed by your physician -Make any legal decisions or sign important papers.  Meals: Start with liquid foods such as gelatin or soup. Progress to regular foods as tolerated. Avoid greasy, spicy, heavy foods. If nausea and/or vomiting occur, drink only clear liquids until the nausea and/or vomiting subsides. Call your physician if vomiting continues.  Special Instructions/Symptoms: Your throat may feel dry or sore from the anesthesia or the breathing tube placed in your throat during surgery. If this causes discomfort, gargle with warm salt water. The discomfort should disappear within 24 hours.

## 2019-04-28 NOTE — Anesthesia Procedure Notes (Signed)
Procedure Name: Intubation Date/Time: 04/28/2019 7:36 AM Performed by: Minerva Ends, CRNA Pre-anesthesia Checklist: Patient identified, Emergency Drugs available, Suction available and Patient being monitored Patient Re-evaluated:Patient Re-evaluated prior to induction Oxygen Delivery Method: Circle System Utilized Preoxygenation: Pre-oxygenation with 100% oxygen Induction Type: IV induction Ventilation: Mask ventilation with difficulty and Two handed mask ventilation required Laryngoscope Size: Miller and 2 Grade View: Grade I Tube type: Oral Number of attempts: 1 Airway Equipment and Method: Stylet Placement Confirmation: ETT inserted through vocal cords under direct vision,  positive ETCO2 and breath sounds checked- equal and bilateral Secured at: 22 cm Tube secured with: Tape Dental Injury: Teeth and Oropharynx as per pre-operative assessment  Comments: Smooth IV induction Houser-- intubation - AM CRNA atraumatic-- teeth and mouth as preop- many chipped teeth in front upper and lower-- pt did not report in preop-- unchanged with laryngoscopy-- bilat BS Houser

## 2019-04-28 NOTE — Transfer of Care (Signed)
Immediate Anesthesia Transfer of Care Note  Patient: Tony Santos  Procedure(s) Performed: LAPAROSCOPIC CHOLECYSTECTOMY WITH INTRAOPERATIVE CHOLANGIOGRAM (N/A Abdomen)  Patient Location: PACU  Anesthesia Type:General  Level of Consciousness: awake and alert   Airway & Oxygen Therapy: Patient Spontanous Breathing and Patient connected to face mask oxygen  Post-op Assessment: Report given to RN and Post -op Vital signs reviewed and stable  Post vital signs: Reviewed and stable  Last Vitals:  Vitals Value Taken Time  BP 137/68 04/28/19 0916  Temp    Pulse 79 04/28/19 0917  Resp 15 04/28/19 0917  SpO2 99 % 04/28/19 0917  Vitals shown include unvalidated device data.  Last Pain:  Vitals:   04/28/19 0603  TempSrc: Oral  PainSc: 0-No pain      Patients Stated Pain Goal: 4 (04/28/19 0603)  Complications: No apparent anesthesia complications

## 2019-04-29 LAB — SURGICAL PATHOLOGY

## 2021-08-15 ENCOUNTER — Other Ambulatory Visit: Payer: Self-pay | Admitting: Internal Medicine

## 2021-08-15 DIAGNOSIS — N1832 Chronic kidney disease, stage 3b: Secondary | ICD-10-CM

## 2021-08-22 ENCOUNTER — Ambulatory Visit
Admission: RE | Admit: 2021-08-22 | Discharge: 2021-08-22 | Disposition: A | Discharge status: Home / Self Care | Payer: BC Managed Care – PPO | Source: Ambulatory Visit | Attending: Internal Medicine | Admitting: Internal Medicine

## 2021-08-22 DIAGNOSIS — N1832 Chronic kidney disease, stage 3b: Secondary | ICD-10-CM

## 2022-07-31 IMAGING — US US RENAL
1 series · 14 of 25 positions shown · non-contrast
Comparison: None Available.

CLINICAL DATA: Chronic renal disease.

EXAM:
RENAL / URINARY TRACT ULTRASOUND COMPLETE

[Series 1: us renal · 0.26mm/px · 14 of 47 slices shown]
[im 1/47]
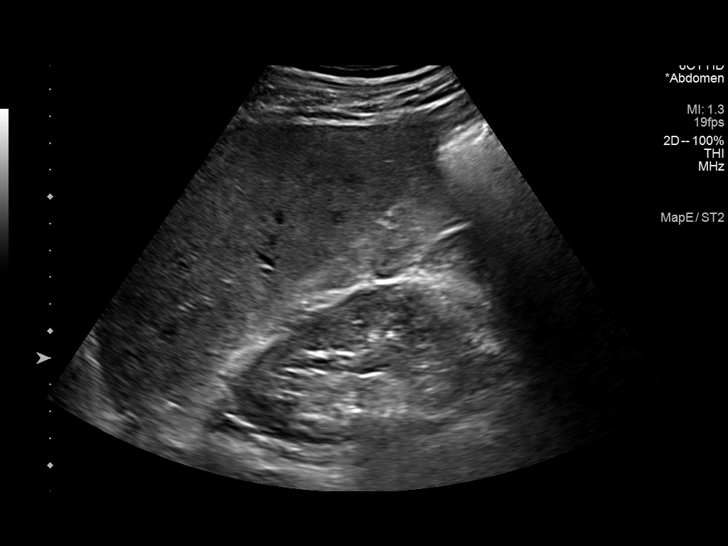
[im 4/47]
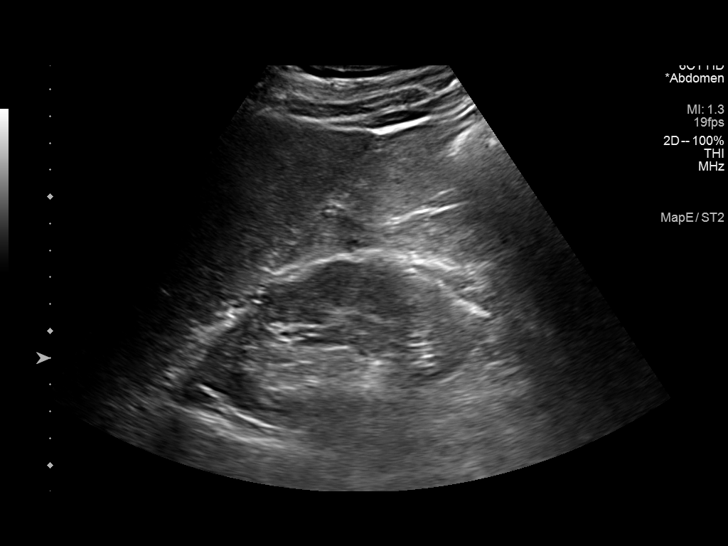
[im 8/47]
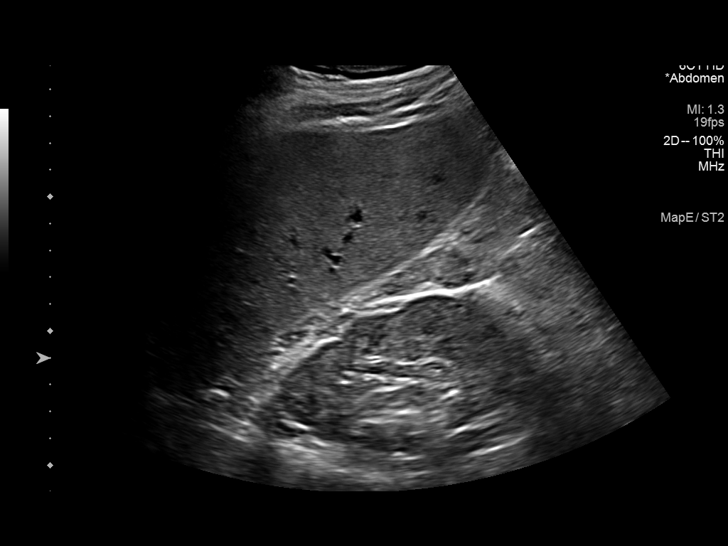
[im 12/47]
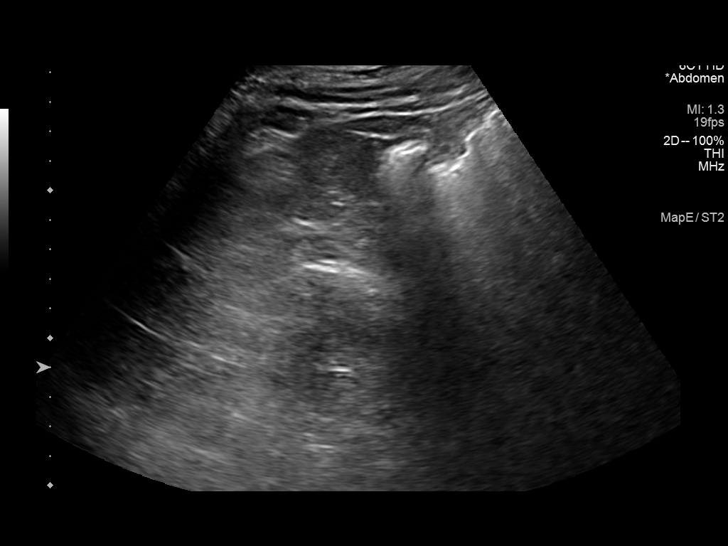
[im 16/47]
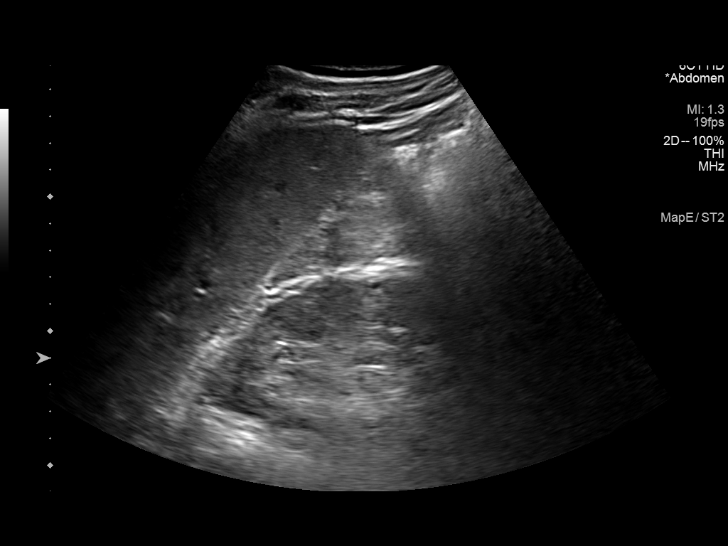
[im 18/47]
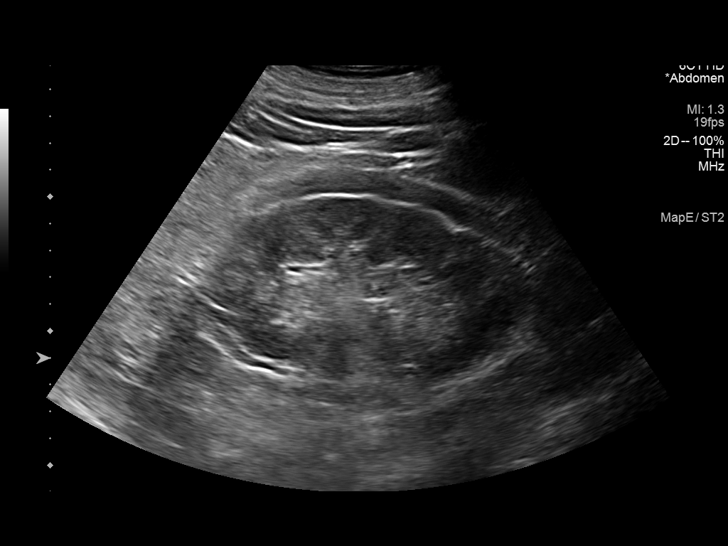
[im 22/47]
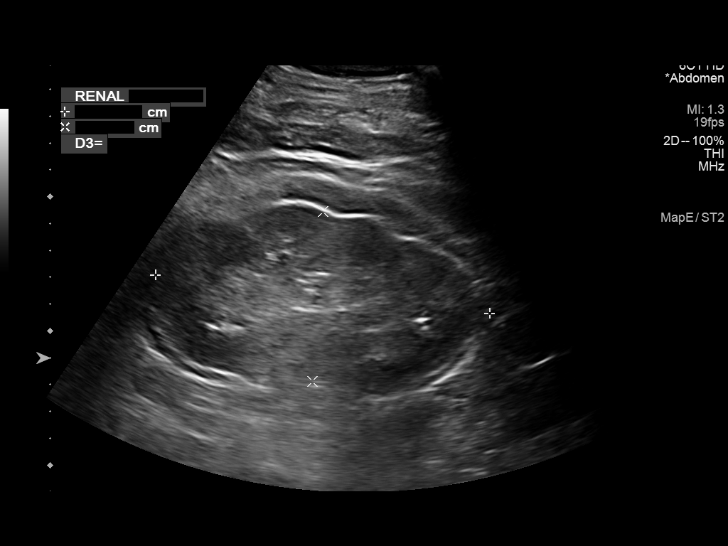
[im 25/47]
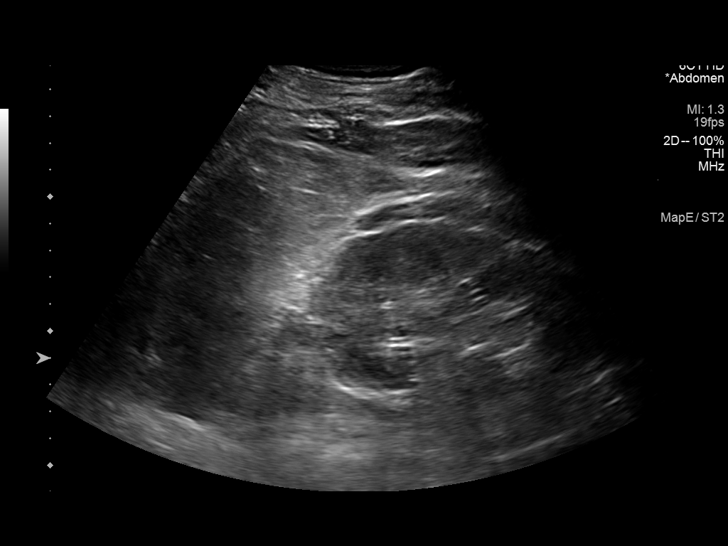
[im 29/47]
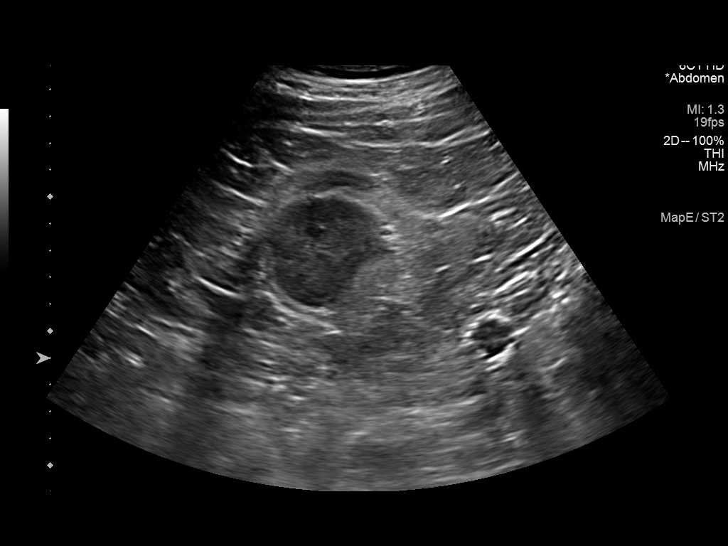
[im 31/47]
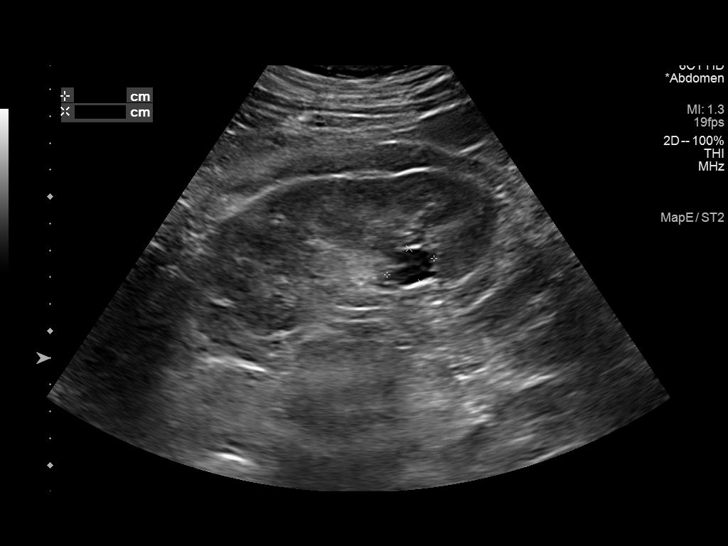
[im 35/47]
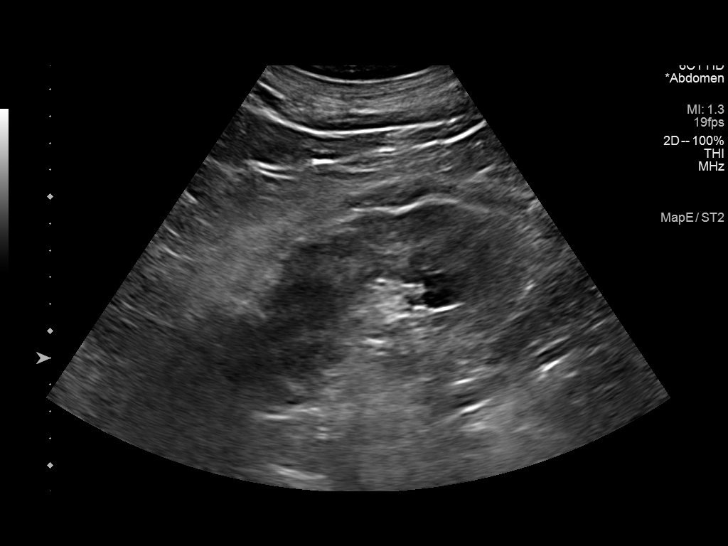
[im 39/47]
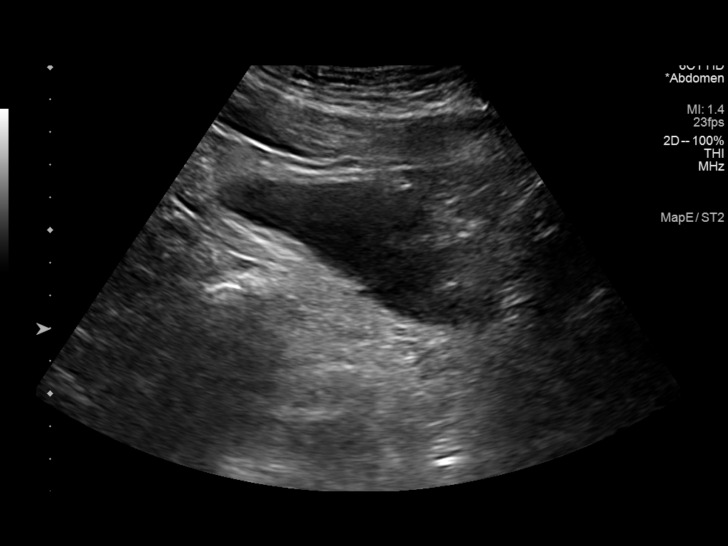
[im 43/47]
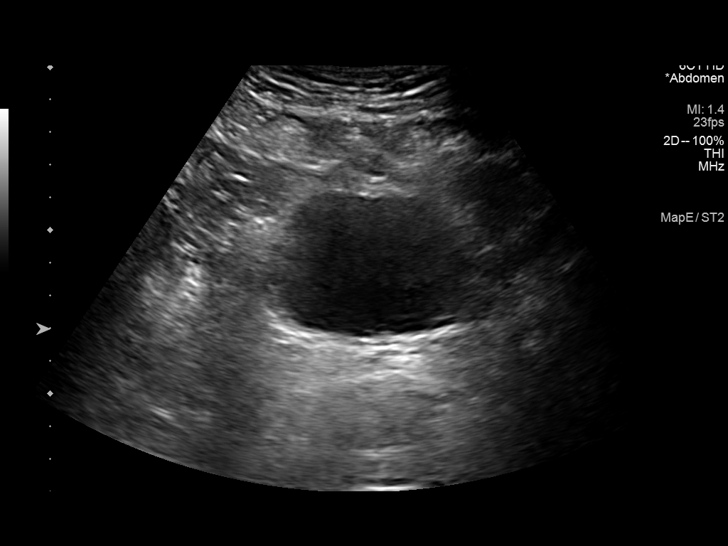
[im 47/47]
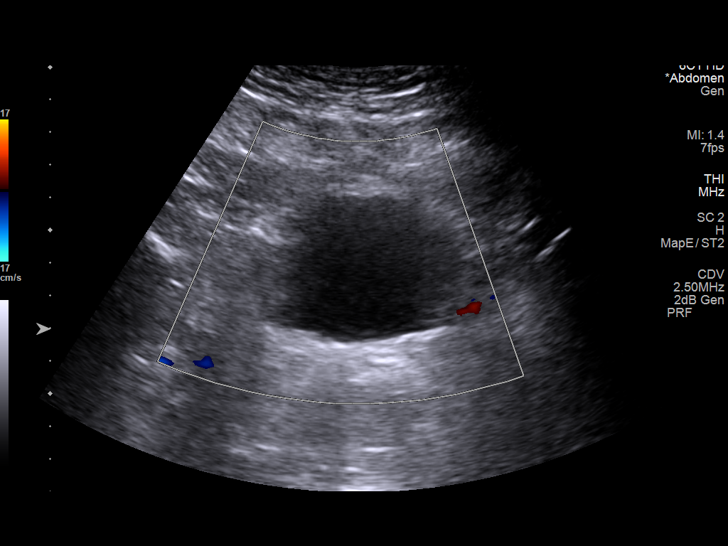

[14 of 25 positions shown; findings below may reference images not displayed]

FINDINGS: Right Kidney:

Renal measurements: 11.3 x 5.4 x 6.0 cm = volume: 193.3 mL.
Increased echogenicity. No mass or hydronephrosis visualized.

Left Kidney:

Renal measurements: 12.5 x 6.4 x 6.3 cm = volume: 264.3 mL.
Increased echogenicity. No mass or hydronephrosis visualized. There
is a 1.9 x 1.4 x 1.1 cm cyst within the inferior pole of the left
kidney.

Bladder:

Appears normal for degree of bladder distention.

Other:

None.
IMPRESSION: Increased echogenicity bilaterally compatible with chronic medical
renal disease.

## 2023-10-18 ENCOUNTER — Encounter: Payer: Self-pay | Admitting: Advanced Practice Midwife
